# Patient Record
Sex: Female | Born: 1950 | Hispanic: Yes | Marital: Single | Smoking: Never smoker
Health system: Southern US, Community
[De-identification: ages and names within clinical notes are randomized; demographics above are authoritative.]

## PROBLEM LIST (undated history)

## (undated) DIAGNOSIS — J449 Chronic obstructive pulmonary disease, unspecified: Secondary | ICD-10-CM

---

## 2019-11-30 ENCOUNTER — Other Ambulatory Visit: Payer: Self-pay

## 2019-11-30 ENCOUNTER — Ambulatory Visit: Payer: Self-pay | Attending: Internal Medicine

## 2019-11-30 ENCOUNTER — Encounter (HOSPITAL_COMMUNITY): Payer: Self-pay

## 2019-11-30 ENCOUNTER — Emergency Department (HOSPITAL_COMMUNITY): Payer: Self-pay

## 2019-11-30 ENCOUNTER — Inpatient Hospital Stay (HOSPITAL_COMMUNITY)
Admission: EM | Admit: 2019-11-30 | Discharge: 2019-12-10 | DRG: 190 | Disposition: A | Discharge status: Home / Self Care | Payer: Self-pay | Attending: Internal Medicine | Admitting: Internal Medicine

## 2019-11-30 DIAGNOSIS — G9341 Metabolic encephalopathy: Secondary | ICD-10-CM | POA: Diagnosis present

## 2019-11-30 DIAGNOSIS — I358 Other nonrheumatic aortic valve disorders: Secondary | ICD-10-CM | POA: Diagnosis present

## 2019-11-30 DIAGNOSIS — E662 Morbid (severe) obesity with alveolar hypoventilation: Secondary | ICD-10-CM | POA: Diagnosis present

## 2019-11-30 DIAGNOSIS — T380X5A Adverse effect of glucocorticoids and synthetic analogues, initial encounter: Secondary | ICD-10-CM | POA: Diagnosis present

## 2019-11-30 DIAGNOSIS — Z9981 Dependence on supplemental oxygen: Secondary | ICD-10-CM

## 2019-11-30 DIAGNOSIS — R791 Abnormal coagulation profile: Secondary | ICD-10-CM | POA: Diagnosis present

## 2019-11-30 DIAGNOSIS — E875 Hyperkalemia: Secondary | ICD-10-CM | POA: Diagnosis present

## 2019-11-30 DIAGNOSIS — I2781 Cor pulmonale (chronic): Secondary | ICD-10-CM | POA: Diagnosis present

## 2019-11-30 DIAGNOSIS — J9622 Acute and chronic respiratory failure with hypercapnia: Secondary | ICD-10-CM | POA: Diagnosis present

## 2019-11-30 DIAGNOSIS — J441 Chronic obstructive pulmonary disease with (acute) exacerbation: Principal | ICD-10-CM | POA: Diagnosis present

## 2019-11-30 DIAGNOSIS — E872 Acidosis: Secondary | ICD-10-CM | POA: Diagnosis present

## 2019-11-30 DIAGNOSIS — Z86718 Personal history of other venous thrombosis and embolism: Secondary | ICD-10-CM

## 2019-11-30 DIAGNOSIS — J9621 Acute and chronic respiratory failure with hypoxia: Secondary | ICD-10-CM | POA: Diagnosis present

## 2019-11-30 DIAGNOSIS — Z7901 Long term (current) use of anticoagulants: Secondary | ICD-10-CM

## 2019-11-30 DIAGNOSIS — D751 Secondary polycythemia: Secondary | ICD-10-CM | POA: Diagnosis present

## 2019-11-30 DIAGNOSIS — J9811 Atelectasis: Secondary | ICD-10-CM | POA: Diagnosis not present

## 2019-11-30 DIAGNOSIS — E1165 Type 2 diabetes mellitus with hyperglycemia: Secondary | ICD-10-CM | POA: Diagnosis present

## 2019-11-30 DIAGNOSIS — Z6835 Body mass index (BMI) 35.0-35.9, adult: Secondary | ICD-10-CM

## 2019-11-30 DIAGNOSIS — I5032 Chronic diastolic (congestive) heart failure: Secondary | ICD-10-CM | POA: Diagnosis present

## 2019-11-30 DIAGNOSIS — Z23 Encounter for immunization: Secondary | ICD-10-CM

## 2019-11-30 DIAGNOSIS — I110 Hypertensive heart disease with heart failure: Secondary | ICD-10-CM | POA: Diagnosis present

## 2019-11-30 DIAGNOSIS — R0602 Shortness of breath: Secondary | ICD-10-CM

## 2019-11-30 DIAGNOSIS — Z79899 Other long term (current) drug therapy: Secondary | ICD-10-CM

## 2019-11-30 DIAGNOSIS — D1779 Benign lipomatous neoplasm of other sites: Secondary | ICD-10-CM | POA: Diagnosis present

## 2019-11-30 DIAGNOSIS — X58XXXA Exposure to other specified factors, initial encounter: Secondary | ICD-10-CM | POA: Diagnosis present

## 2019-11-30 DIAGNOSIS — R0989 Other specified symptoms and signs involving the circulatory and respiratory systems: Secondary | ICD-10-CM

## 2019-11-30 DIAGNOSIS — Z20822 Contact with and (suspected) exposure to covid-19: Secondary | ICD-10-CM | POA: Diagnosis present

## 2019-11-30 DIAGNOSIS — K5900 Constipation, unspecified: Secondary | ICD-10-CM | POA: Diagnosis present

## 2019-11-30 HISTORY — DX: Chronic obstructive pulmonary disease, unspecified: J44.9

## 2019-11-30 LAB — BASIC METABOLIC PANEL WITH GFR
Anion gap: 9 (ref 5–15)
BUN: 20 mg/dL (ref 8–23)
CO2: 32 mmol/L (ref 22–32)
Calcium: 8.6 mg/dL — ABNORMAL LOW (ref 8.9–10.3)
Chloride: 101 mmol/L (ref 98–111)
Creatinine, Ser: 1.03 mg/dL — ABNORMAL HIGH (ref 0.44–1.00)
GFR calc Af Amer: >60 mL/min
GFR calc non Af Amer: 56 mL/min — ABNORMAL LOW
Glucose, Bld: 116 mg/dL — ABNORMAL HIGH (ref 70–99)
Potassium: 5.1 mmol/L (ref 3.5–5.1)
Sodium: 142 mmol/L (ref 135–145)

## 2019-11-30 LAB — CBC WITH DIFFERENTIAL/PLATELET
Abs Immature Granulocytes: 0.08 10*3/uL — ABNORMAL HIGH (ref 0.00–0.07)
Basophils Absolute: 0 10*3/uL (ref 0.0–0.1)
Basophils Relative: 0 %
Eosinophils Absolute: 0.1 10*3/uL (ref 0.0–0.5)
Eosinophils Relative: 1 %
HCT: 52.3 % — ABNORMAL HIGH (ref 36.0–46.0)
Hemoglobin: 15.3 g/dL — ABNORMAL HIGH (ref 12.0–15.0)
Immature Granulocytes: 1 %
Lymphocytes Relative: 28 %
Lymphs Abs: 2.2 10*3/uL (ref 0.7–4.0)
MCH: 30.4 pg (ref 26.0–34.0)
MCHC: 29.3 g/dL — ABNORMAL LOW (ref 30.0–36.0)
MCV: 103.8 fL — ABNORMAL HIGH (ref 80.0–100.0)
Monocytes Absolute: 0.7 10*3/uL (ref 0.1–1.0)
Monocytes Relative: 9 %
Neutro Abs: 4.6 10*3/uL (ref 1.7–7.7)
Neutrophils Relative %: 61 %
Platelets: 200 10*3/uL (ref 150–400)
RBC: 5.04 MIL/uL (ref 3.87–5.11)
RDW: 18 % — ABNORMAL HIGH (ref 11.5–15.5)
WBC: 7.6 10*3/uL (ref 4.0–10.5)
nRBC: 0.4 % — ABNORMAL HIGH (ref 0.0–0.2)

## 2019-11-30 LAB — I-STAT BETA HCG BLOOD, ED (MC, WL, AP ONLY): I-stat hCG, quantitative: <5 m[IU]/mL

## 2019-11-30 LAB — BRAIN NATRIURETIC PEPTIDE: B Natriuretic Peptide: 69.2 pg/mL (ref 0.0–100.0)

## 2019-11-30 MED ORDER — PREDNISONE 20 MG PO TABS: 40.0000 mg | ORAL_TABLET | Freq: Every day | ORAL | Status: DC

## 2019-11-30 MED ORDER — AZITHROMYCIN 500 MG PO TABS
250.0000 mg | ORAL_TABLET | Freq: Every day | ORAL | Status: AC
  Administered 2019-12-01 – 2019-12-04 (×4): 250 mg via ORAL
  Filled 2019-11-30 (×4): qty 1

## 2019-11-30 MED ORDER — PANTOPRAZOLE SODIUM 20 MG PO TBEC
20.0000 mg | DELAYED_RELEASE_TABLET | Freq: Every day | ORAL | Status: DC
  Filled 2019-11-30 (×2): qty 1

## 2019-11-30 MED ORDER — SODIUM CHLORIDE 0.9% FLUSH
3.0000 mL | Freq: Two times a day (BID) | INTRAVENOUS | Status: DC
  Administered 2019-12-01 – 2019-12-10 (×19): 3 mL via INTRAVENOUS

## 2019-11-30 MED ORDER — METHYLPREDNISOLONE SODIUM SUCC 125 MG IJ SOLR
125.0000 mg | Freq: Once | INTRAMUSCULAR | Status: AC
  Administered 2019-11-30: 125 mg via INTRAVENOUS
  Filled 2019-11-30: qty 2

## 2019-11-30 MED ORDER — FUROSEMIDE 10 MG/ML IJ SOLN
40.0000 mg | Freq: Once | INTRAMUSCULAR | Status: AC
  Administered 2019-11-30: 40 mg via INTRAVENOUS
  Filled 2019-11-30: qty 4

## 2019-11-30 MED ORDER — ALBUTEROL SULFATE HFA 108 (90 BASE) MCG/ACT IN AERS
2.0000 | INHALATION_SPRAY | Freq: Once | RESPIRATORY_TRACT | Status: AC
  Administered 2019-11-30: 2 via RESPIRATORY_TRACT
  Filled 2019-11-30: qty 6.7

## 2019-11-30 MED ORDER — ALBUTEROL SULFATE HFA 108 (90 BASE) MCG/ACT IN AERS
4.0000 | INHALATION_SPRAY | Freq: Once | RESPIRATORY_TRACT | Status: AC
  Administered 2019-11-30: 4 via RESPIRATORY_TRACT
  Filled 2019-11-30: qty 6.7

## 2019-11-30 MED ORDER — AZITHROMYCIN 250 MG PO TABS
500.0000 mg | ORAL_TABLET | Freq: Every day | ORAL | Status: AC
  Administered 2019-12-01: 500 mg via ORAL
  Filled 2019-11-30: qty 2

## 2019-11-30 NOTE — ED Notes (Signed)
[name], son with # listed in contacts, requesting update. Provider aware

## 2019-11-30 NOTE — ED Triage Notes (Signed)
[place] and has had no oxygen x 4 days for her COPD. Oxygen sats 90, no complints

## 2019-11-30 NOTE — Progress Notes (Signed)
   Covid-19 Vaccination Clinic  Name:  Lucas Exline    MRN: [id number] DOB: 04/10/1951  11/30/2019  Ms. Henault was observed post Covid-19 immunization for 15 minutes without incident. She was provided with Vaccine Information Sheet and instruction to access the V-Safe system.   Ms. Meinders was instructed to call 911 with any severe reactions post vaccine: Marland Kitchen Difficulty breathing  . Swelling of face and throat  . A fast heartbeat  . A bad rash all over body  . Dizziness and weakness   Immunizations Administered    Name Date Dose VIS Date Route   JANSSEN COVID-19 VACCINE 11/30/2019  9:02 AM 0.5 mL 08/03/2019 Intramuscular   Manufacturer: [employer]   Lot: [id number]   [place]: [id number]

## 2019-11-30 NOTE — ED Notes (Signed)
Pt placed on cardiac moniter

## 2019-11-30 NOTE — ED Provider Notes (Signed)
Care assumed from [facility], please see her note for full details, but in brief Kelly Booker is a 69 y.o. female who traveled here 4 days ago from [place], has a history of COPD and is supposed to wear 2.5 L of oxygen at night.  She states that she started to have worsening shortness of breath today, checked her oxygen sats and they were in the 70s, and felt like she was having a COPD exacerbation.  Patient placed on 2-3 L here in the ED, and since then has been satting well, when taken off of oxygen patient drops into the 80s.  Social work and case management are not currently available to help get patient home oxygen, but patient does not want to be admitted to the hospital per conversations with previous provider.  Chest x-ray with signs of COPD as well as CHF with some pulmonary vascular congestion.  Labs are pending and then will discuss further with patient.  PA feels he spoke with house [facility] who recommended overnight observation in the ED so that case management and social work can facilitate oxygen for patient in the morning if she does not wish to be admitted.  Physical Exam  BP 139/64 (BP Location: Left Arm)   Pulse 61   Temp 98.2 F (36.8 C) (Oral)   Resp (!) 22   SpO2 94%   Physical Exam Constitutional:      Appearance: She is obese. She is not ill-appearing.  Pulmonary:     Breath sounds: Wheezing present.     Comments: On 3 L patient breathing comfortably, she still has wheezes noted bilaterally, with decreased air movement throughout Skin:    General: Skin is warm and dry.  Neurological:     Mental Status: She is alert and oriented to person, place, and time.  Psychiatric:        Mood and Affect: Mood normal.        Behavior: Behavior normal.     ED Course/Procedures   Labs Reviewed  BASIC METABOLIC PANEL - Abnormal; Notable for the following components:      Result Value   Glucose, Bld 116 (*)    Creatinine, Ser 1.03 (*)    Calcium 8.6 (*)    GFR calc non Af Amer  56 (*)    All other components within normal limits  CBC WITH DIFFERENTIAL/PLATELET - Abnormal; Notable for the following components:   Hemoglobin 15.3 (*)    HCT 52.3 (*)    MCV 103.8 (*)    MCHC 29.3 (*)    RDW 18.0 (*)    nRBC 0.4 (*)    Abs Immature Granulocytes 0.08 (*)    All other components within normal limits  BRAIN NATRIURETIC PEPTIDE   EKG Interpretation  Date/Time:  Saturday November 30 2019 19:40:06 EDT Ventricular Rate:  62 PR Interval:  182 QRS Duration: 102 QT Interval:  418 QTC Calculation: 424 R Axis:   81 Text Interpretation: Normal sinus rhythm Normal ECG No prior eCG for comparison. No STEMI Confirmed by Antony Blackbird [phone]) on 11/30/2019 9:00:35 PM  DG Chest 2 View  Result Date: 11/30/2019 CLINICAL DATA:  On oxygen did in at home in [place]. She was not allowed to bring her oxygen tank to the [place]. EXAM: CHEST - 2 VIEW COMPARISON:  None. FINDINGS: Enlarged cardiac silhouette. Mildly prominent pulmonary vasculature and interstitial markings without Kerley lines. Small amount of linear atelectasis or scarring in the left lower lung zone and right lung  base. The lungs are mildly hyperexpanded. No pleural fluid. Diffuse osteopenia. IMPRESSION: 1. Cardiomegaly and mild pulmonary vascular congestion. 2. Mild changes of COPD. Electronically Signed   By: Claudie Revering M.D.   On: 11/30/2019 19:14   Procedures  MDM   Using Spanish interpreter results of patient's work-up discussed with her, at baseline she usually only requires oxygen at night, but now is requiring oxygen consistently where she desats into the 80s.  Her chest x-ray also shows possible signs of CHF.  Discussed this with the patient and that we could not get her oxygen to go home with until tomorrow morning, she was initially hesitant to be admitted, but I called and spoke with her son Kelly Booker who is also very concerned about her and would like her to be admitted to the hospital and she is now  agreeable.  Will consult hospitalist for admission.  Case discussed with Dr. Marcello Moores with [facility] who will see admit the patient, she would like to add on an ABG, troponin and D-dimer, given limited history from patient and unclear medical history.      Jacqlyn Larsen, PA-C 12/01/19 [id number]    Virgel Manifold, MD 12/01/19 1525

## 2019-11-30 NOTE — ED Notes (Signed)
Patient transported to X-ray 

## 2019-11-30 NOTE — H&P (Signed)
History and Physical    Kimbree Casanas MOQ:[id number] DOB: 1950/11/26 DOA: 11/30/2019  PCP: Patient, No Pcp Per  Patient coming from: son's home  I have personally briefly reviewed patient's old medical records in [place]  Chief Complaint: sob , requesting oxygen  HPI: Mava Suares is a 69 y.o. female with medical history significant of  COPD on nocturnal O2 2L, use of anticoagulation for currently unknown diagnosis. Patient is spanish speaking and difficult historian per chart patient arrived from [place] 4 days ago without her nocturnal O2 due not being allowed to transport O2 on her flight. Patient states she woke up this am with increase sob and checked her O2 saturation at home and noted it was in the 70's.  She notes she does not require O2 other than at night time at her baseline. She denies any increase cough or sputum production, any fever, chills, chest pain , palpitations, presyncope, leg swelling, leg pain or orthopnea. Patient also denies any cardiac history.  When asked why she is on anticoagulation with xarleto she states she was told her blood was too thick.  In any event patient presents to ed in house of obtaining O2 tank. Patient also states that this am she has a syncopal episode which she states was due to her lack of oxygen.  Patient was not able to elaborate further.  ED Course: Afeb, bp 113/67, hr 78, rr18,  Sat 92% o 2L  ekg nsr , no st -twave changes   Cxr: IMPRESSION: 1. Cardiomegaly and mild pulmonary vascular congestion. 2. Mild changes of COPD.  Labs: wbc 7.6, hbg 15.3/HCT, MCV 103.8 K:5.1, glu 116, cr 1.03 BNP 69.2 Review of Systems: As per HPI otherwise 10 point review of systems negative.   [id number]  Past Medical History:  Diagnosis Date  . COPD (chronic obstructive pulmonary disease) ([place])     History reviewed. No pertinent surgical history.   has no history on file for tobacco use, alcohol use, and drug use.  No Known  Allergies  No family history on file.  Prior to Admission medications   Medication Sig Start Date End Date Taking? Authorizing Provider  OXYGEN Inhale 2.5-3 L into the lungs continuous.    Yes [provider]  PRESCRIPTION MEDICATION Take 1,000 mg by mouth daily at 2 PM. "[place]on" from [facility]   Yes [provider]  PRESCRIPTION MEDICATION Take 25 mg by mouth daily. "Spirotard" from [place]   Yes [provider]  PRESCRIPTION MEDICATION Take 12 mcg by mouth daily. "Aerofor" from [place] -  inhale the contents of one capsule (12 mcg) into the lungs once daily   Yes [provider]  rivaroxaban (XARELTO) 20 MG TABS tablet Take 20 mg by mouth every morning.   Yes [provider]  tiotropium (SPIRIVA) 18 MCG inhalation capsule Place 18 mcg into inhaler and inhale daily.   Yes [provider]    Physical Exam: Vitals:   11/30/19 1405 11/30/19 1953  BP: 113/67 139/64  Pulse: 78 61  Resp: 18 (!) 22  Temp: 98.2 F (36.8 C)   TempSrc: Oral   SpO2: 92% 94%    Constitutional: NAD, calm, comfortable Vitals:   11/30/19 1405 11/30/19 1953  BP: 113/67 139/64  Pulse: 78 61  Resp: 18 (!) 22  Temp: 98.2 F (36.8 C)   TempSrc: Oral   SpO2: 92% 94%   Eyes: PERRL, lids and conjunctivae normal ENMT: Mucous membranes are moist. Posterior pharynx clear of any  exudate or lesions.Normal dentition.  Neck: normal, supple, no masses, no thyromegaly Respiratory: clear to auscultation bilaterally, no wheezing, no crackles. Normal respiratory effort. No accessory muscle use.  Cardiovascular: Regular rate and rhythm, no murmurs / rubs / gallops. No extremity edema. 2+ pedal pulses. No carotid bruits.  Abdomen: no tenderness, no masses palpated. No hepatosplenomegaly. Bowel sounds positive.  Musculoskeletal: no clubbing / cyanosis. No joint deformity upper and lower extremities. Good ROM, no contractures. Normal muscle tone.  Skin: no rashes,  lesions, ulcers. No induration Neurologic: CN 2-12 grossly intact. Sensation intact, DTR normal. Strength 5/5 in all 4.  Psychiatric: Normal judgment and insight. Alert and oriented x 3. Normal mood.    Labs on Admission: I have personally reviewed following labs and imaging studies  CBC: Recent Labs  Lab 11/30/19 2040  WBC 7.6  NEUTROABS 4.6  HGB 15.3*  HCT 52.3*  MCV 103.8*  PLT 419   Basic Metabolic Panel: Recent Labs  Lab 11/30/19 2040  NA 142  K 5.1  CL 101  CO2 32  GLUCOSE 116*  BUN 20  CREATININE 1.03*  CALCIUM 8.6*   GFR: CrCl cannot be calculated (Unknown ideal weight.). Liver Function Tests: No results for input(s): AST, ALT, ALKPHOS, BILITOT, PROT, ALBUMIN in the last 168 hours. No results for input(s): LIPASE, AMYLASE in the last 168 hours. No results for input(s): AMMONIA in the last 168 hours. Coagulation Profile: No results for input(s): INR, PROTIME in the last 168 hours. Cardiac Enzymes: No results for input(s): CKTOTAL, CKMB, CKMBINDEX, TROPONINI in the last 168 hours. BNP (last 3 results) No results for input(s): PROBNP in the last 8760 hours. HbA1C: No results for input(s): HGBA1C in the last 72 hours. CBG: No results for input(s): GLUCAP in the last 168 hours. Lipid Profile: No results for input(s): CHOL, HDL, LDLCALC, TRIG, CHOLHDL, LDLDIRECT in the last 72 hours. Thyroid Function Tests: No results for input(s): TSH, T4TOTAL, FREET4, T3FREE, THYROIDAB in the last 72 hours. Anemia Panel: No results for input(s): VITAMINB12, FOLATE, FERRITIN, TIBC, IRON, RETICCTPCT in the last 72 hours. Urine analysis: No results found for: COLORURINE, APPEARANCEUR, [place], [place], GLUCOSEU, [place], [place], [place], PROTEINUR, [place], NITRITE, LEUKOCYTESUR  Radiological Exams on Admission: DG Chest 2 View  Result Date: 11/30/2019 CLINICAL DATA:  On oxygen did in at home in [place]. She was not allowed to bring her oxygen tank to the [place]. EXAM: CHEST - 2 VIEW COMPARISON:  None. FINDINGS: Enlarged cardiac silhouette. Mildly prominent pulmonary vasculature and interstitial markings without Kerley lines. Small amount of linear atelectasis or scarring in the left lower lung zone and right lung base. The lungs are mildly hyperexpanded. No pleural fluid. Diffuse osteopenia. IMPRESSION: 1. Cardiomegaly and mild pulmonary vascular congestion. 2. Mild changes of COPD. Electronically Signed   By: Claudie Revering M.D.   On: 11/30/2019 19:14    EKG: Independently reviewed.see above  Assessment/Plan Acute COPD exacerbation with acute hypoxic and hypercarbic respiratory failure  -prednisone/azithromycin  - abg with increase  Ph 7.38 of c02 to 83   -place on bipap  -neb standing and prn   New DX of Congestive heart failure /element of Corpulmonale -ef unknown  -lasix bid iv  -echo in am  -cycle ce  -consider cardiology consult   ? PE/DVT hx  -patient unable to give diagnosis for which she uses xarelto  -will continue   FEN Replete electrolytes prn  DVT prophylaxis: xarelto  Code Status: Full Family Communication: discussed with son  Disposition  Plan:3-5 days  Consults called: consider cardiology consult in am  Admission status: SDU :Clance Boll MD [facility]  If 7PM-7AM, please contact night-coverage [email or web address] Password [facility]  11/30/2019, 11:03 PM

## 2019-11-30 NOTE — ED Provider Notes (Signed)
[place] EMERGENCY DEPARTMENT Provider Note   CSN: [id number] Arrival date & time: 11/30/19  1400     History Chief Complaint  Patient presents with  . no oxygen tank    Kelly Booker is a 69 y.o. female with history of COPD presents for evaluation of acute onset, persistent shortness of breath since this morning.  She reports that she traveled from [place] on 11/28/2019 2 days ago.  She reports that she had a 2-hour flight from [place] to [place] followed by a 3-hour flight from [place] to [place].  She is currently staying with her son who lives here.  She awoke this morning feeling short of breath and checked her oxygen saturations and noted them to be in the 70s.  She states that she typically wears 2.5LPM oxygen at night at home in [place] but states that they did not allow her to bring her oxygen tank on the plane.  She is a never smoker.  She denies chest pain, fevers, cough, abdominal pain, nausea, vomiting, leg pain or leg swelling.  She states she has been taking all of her home medications.  She has her medication at the bedside.  Some of the medications are in Spanish so I am not sure what they are but it appears that she is on Spiriva as well as Xarelto.  When I asked her why she is on Xarelto she states "they told me that my blood was too thick".  When asked specifically about DVT and PE she denies this but is unsure.  Patient is Spanish-speaking and a Optometrist was used throughout the encounter.  The history is provided by the patient. The history is limited by a language barrier. A language interpreter was used.       Past Medical History:  Diagnosis Date  . COPD (chronic obstructive pulmonary disease) ([facility])     There are no problems to display for this patient.   History reviewed. No pertinent surgical history.   OB History   No obstetric history on file.     No family history on file.  Social History   Tobacco Use  . Smoking  status: Not on file  Substance Use Topics  . Alcohol use: Not on file  . Drug use: Not on file    Home Medications Prior to Admission medications   Not on File    Allergies    Patient has no known allergies.  Review of Systems   Review of Systems  Constitutional: Negative for chills and fever.  Respiratory: Positive for shortness of breath. Negative for cough.   Cardiovascular: Negative for chest pain and leg swelling.  Gastrointestinal: Negative for abdominal pain, nausea and vomiting.  All other systems reviewed and are negative.   Physical Exam Updated Vital Signs BP 139/64 (BP Location: Left Arm)   Pulse 61   Temp 98.2 F (36.8 C) (Oral)   Resp (!) 22   SpO2 94%   Physical Exam Vitals and nursing note reviewed.  Constitutional:      General: She is not in acute distress.    Appearance: She is well-developed.  HENT:     Head: Normocephalic and atraumatic.  Eyes:     General:        Right eye: No discharge.        Left eye: No discharge.     Conjunctiva/sclera: Conjunctivae normal.  Neck:     Vascular: No JVD.     Trachea: No tracheal deviation.  Cardiovascular:     Rate and Rhythm: Normal rate and regular rhythm.     Pulses: Normal pulses.     Comments: 2+ radial and DP/PT pulses bilaterally, Homans sign absent bilaterally, no lower extremity edema, no palpable cords, compartments are soft   Pulmonary:     Effort: Pulmonary effort is normal.     Comments: Globally diminished breath sounds.  SPO2 saturations dropped to 88% on room air.  Improved on 2 to 3 L via nasal cannula. Abdominal:     General: Bowel sounds are normal. There is no distension.     Tenderness: There is no abdominal tenderness. There is no guarding or rebound.  Musculoskeletal:     Cervical back: Neck supple.  Skin:    General: Skin is warm and dry.     Findings: No erythema.  Neurological:     Mental Status: She is alert.  Psychiatric:        Behavior: Behavior normal.      ED Results / Procedures / Treatments   Labs (all labs ordered are listed, but only abnormal results are displayed) Labs Reviewed  BASIC METABOLIC PANEL  CBC WITH DIFFERENTIAL/PLATELET  BRAIN NATRIURETIC PEPTIDE    EKG None  Radiology DG Chest 2 View  Result Date: 11/30/2019 CLINICAL DATA:  On oxygen did in at home in [place]. She was not allowed to bring her oxygen tank to the [place]. EXAM: CHEST - 2 VIEW COMPARISON:  None. FINDINGS: Enlarged cardiac silhouette. Mildly prominent pulmonary vasculature and interstitial markings without Kerley lines. Small amount of linear atelectasis or scarring in the left lower lung zone and right lung base. The lungs are mildly hyperexpanded. No pleural fluid. Diffuse osteopenia. IMPRESSION: 1. Cardiomegaly and mild pulmonary vascular congestion. 2. Mild changes of COPD. Electronically Signed   By: Claudie Revering M.D.   On: 11/30/2019 19:14    Procedures Procedures (including critical care time)  Medications Ordered in ED Medications  methylPREDNISolone sodium succinate (SOLU-MEDROL) 125 mg/2 mL injection 125 mg (has no administration in time range)  albuterol (VENTOLIN HFA) 108 (90 Base) MCG/ACT inhaler 2 puff (has no administration in time range)    ED Course  I have reviewed the triage vital signs and the nursing notes.  Pertinent labs & imaging results that were available during my care of the patient were reviewed by me and considered in my medical decision making (see chart for details).    MDM Rules/Calculators/A&P                          Patient presents requesting an oxygen tank.  She is afebrile, SPO2 saturations down to 88% on room air.  She reports she typically only uses oxygen at night.  States that her O2 saturations today upon awakening were in the 70s and she felt short of breath.  She states she was unable to take her oxygen on the airplane to get to the [place].  She is mildly tachypneic but does not exhibit markedly  increased work of breathing.  She denies leg pain, leg swelling, or chest pain.  PE is certainly on the differential however she feels strongly that her symptoms are consistent with her usual COPD exacerbations.  She is on Xarelto based on the medications she has at the bedside but she cannot tell me why she is on Xarelto.  She denies definite history of DVT or PE.  We will plan to obtain lab work, chest x-ray,  EKG for further evaluation.  I explained to the patient that we may not be able to get her supplemental oxygen to go home with and that ultimately with her hypoxia she would likely benefit from admission for management of COPD exacerbation.  She verbalizes that she would like to go home if at all possible and all she really wants is an oxygen tank to go home with.  In the meantime we will give IV Solu-Medrol and albuterol.  EKG shows no acute ischemic abnormalities. Chest x-ray shows changes consistent with COPD as well as cardiomegaly and mild pulmonary vascular congestion which could be suggestive of CHF.  Patient does not appear markedly volume overloaded peripherally but could have a history of CHF though she denies this to me.  8:52 PM Spoke with Dannielle Karvonen.  She has contacted one of the social workers in the hospital (unfortunately our social workers in the ED are gone for the day).  She recommends keeping the patient overnight and consulting case management/social work in the morning to arrange for delivery of a tank.  She reports that historically some of the tanks that we have available have been unreliable and that we need to arrange for an oxygen tank from a formal vendor.  9:00PM Signed out care to oncoming provider PA Ford.  Pending lab work and reassessment.  She would most likely benefit from admission for management of COPD exacerbation. If not agreeable to admission, she may be amenable to staying in the department overnight awaiting case management/social work involvement to arrange  for outpatient supplemental oxygen.    Final Clinical Impression(s) / ED Diagnoses Final diagnoses:  COPD exacerbation [facility])    Rx / [place] Orders ED Discharge Orders    None       Debroah Baller 11/30/19 2101    Virgel Manifold, MD 12/01/19 1525

## 2019-12-01 ENCOUNTER — Encounter (HOSPITAL_COMMUNITY): Payer: Self-pay | Admitting: Internal Medicine

## 2019-12-01 ENCOUNTER — Inpatient Hospital Stay (HOSPITAL_COMMUNITY): Payer: Self-pay

## 2019-12-01 DIAGNOSIS — E875 Hyperkalemia: Secondary | ICD-10-CM

## 2019-12-01 DIAGNOSIS — Z7901 Long term (current) use of anticoagulants: Secondary | ICD-10-CM

## 2019-12-01 LAB — BASIC METABOLIC PANEL
Anion gap: 11 (ref 5–15)
Anion gap: 10 (ref 5–15)
BUN: 20 mg/dL (ref 8–23)
BUN: 22 mg/dL (ref 8–23)
CO2: 35 mmol/L — ABNORMAL HIGH (ref 22–32)
CO2: 36 mmol/L — ABNORMAL HIGH (ref 22–32)
Calcium: 9.2 mg/dL (ref 8.9–10.3)
Calcium: 9.1 mg/dL (ref 8.9–10.3)
Chloride: 94 mmol/L — ABNORMAL LOW (ref 98–111)
Chloride: 95 mmol/L — ABNORMAL LOW (ref 98–111)
Creatinine, Ser: 1.02 mg/dL — ABNORMAL HIGH (ref 0.44–1.00)
Creatinine, Ser: 1.18 mg/dL — ABNORMAL HIGH (ref 0.44–1.00)
GFR calc Af Amer: >60 mL/min (ref >60)
GFR calc Af Amer: 55 mL/min — ABNORMAL LOW (ref >60)
GFR calc non Af Amer: 56 mL/min — ABNORMAL LOW (ref >60)
GFR calc non Af Amer: 47 mL/min — ABNORMAL LOW (ref >60)
Glucose, Bld: 170 mg/dL — ABNORMAL HIGH (ref 70–99)
Glucose, Bld: 172 mg/dL — ABNORMAL HIGH (ref 70–99)
Potassium: 5.2 mmol/L — ABNORMAL HIGH (ref 3.5–5.1)
Potassium: 5.7 mmol/L — ABNORMAL HIGH (ref 3.5–5.1)
Sodium: 140 mmol/L (ref 135–145)
Sodium: 141 mmol/L (ref 135–145)

## 2019-12-01 LAB — CBC
HCT: 55.2 % — ABNORMAL HIGH (ref 36.0–46.0)
Hemoglobin: 16.3 g/dL — ABNORMAL HIGH (ref 12.0–15.0)
MCH: 30.3 pg (ref 26.0–34.0)
MCHC: 29.5 g/dL — ABNORMAL LOW (ref 30.0–36.0)
MCV: 102.6 fL — ABNORMAL HIGH (ref 80.0–100.0)
Platelets: 216 10*3/uL (ref 150–400)
RBC: 5.38 MIL/uL — ABNORMAL HIGH (ref 3.87–5.11)
RDW: 17.9 % — ABNORMAL HIGH (ref 11.5–15.5)
WBC: 9.6 10*3/uL (ref 4.0–10.5)
nRBC: 0.3 % — ABNORMAL HIGH (ref 0.0–0.2)

## 2019-12-01 LAB — TROPONIN I (HIGH SENSITIVITY): Troponin I (High Sensitivity): 19 ng/L — ABNORMAL HIGH (ref <18)

## 2019-12-01 LAB — POCT I-STAT 7, (LYTES, BLD GAS, ICA,H+H)
Acid-Base Excess: 10 mmol/L — ABNORMAL HIGH (ref 0.0–2.0)
Bicarbonate: 41.4 mmol/L — ABNORMAL HIGH (ref 20.0–28.0)
Calcium, Ion: 1.23 mmol/L (ref 1.15–1.40)
HCT: 55 % — ABNORMAL HIGH (ref 36.0–46.0)
Hemoglobin: 18.7 g/dL — ABNORMAL HIGH (ref 12.0–15.0)
O2 Saturation: 90 %
Potassium: 5.1 mmol/L (ref 3.5–5.1)
Sodium: 143 mmol/L (ref 135–145)
TCO2: 44 mmol/L — ABNORMAL HIGH (ref 22–32)
pCO2 arterial: 80.1 mmHg (ref 32.0–48.0)
pH, Arterial: 7.322 — ABNORMAL LOW (ref 7.350–7.450)
pO2, Arterial: 68 mmHg — ABNORMAL LOW (ref 83.0–108.0)

## 2019-12-01 LAB — LACTIC ACID, PLASMA: Lactic Acid, Venous: 0.9 mmol/L (ref 0.5–1.9)

## 2019-12-01 LAB — GLUCOSE, CAPILLARY
Glucose-Capillary: 119 mg/dL — ABNORMAL HIGH (ref 70–99)
Glucose-Capillary: 131 mg/dL — ABNORMAL HIGH (ref 70–99)
Glucose-Capillary: 131 mg/dL — ABNORMAL HIGH (ref 70–99)
Glucose-Capillary: 148 mg/dL — ABNORMAL HIGH (ref 70–99)
Glucose-Capillary: 163 mg/dL — ABNORMAL HIGH (ref 70–99)
Glucose-Capillary: 170 mg/dL — ABNORMAL HIGH (ref 70–99)

## 2019-12-01 LAB — RESPIRATORY PANEL BY PCR

## 2019-12-01 LAB — URINALYSIS, COMPLETE (UACMP) WITH MICROSCOPIC
Bacteria, UA: NONE SEEN
Bilirubin Urine: NEGATIVE
Glucose, UA: NEGATIVE mg/dL
Ketones, ur: NEGATIVE mg/dL
Leukocytes,Ua: NEGATIVE
Nitrite: NEGATIVE
Protein, ur: NEGATIVE mg/dL
Specific Gravity, Urine: 1.008 (ref 1.005–1.030)
pH: 5 (ref 5.0–8.0)

## 2019-12-01 LAB — PROCALCITONIN: Procalcitonin: <0.1 ng/mL

## 2019-12-01 LAB — HEMOGLOBIN A1C
Hgb A1c MFr Bld: 7 % — ABNORMAL HIGH (ref 4.8–5.6)
Mean Plasma Glucose: 154.2 mg/dL

## 2019-12-01 LAB — D-DIMER, QUANTITATIVE: D-Dimer, Quant: 0.42 ug{FEU}/mL (ref 0.00–0.50)

## 2019-12-01 LAB — HIV ANTIBODY (ROUTINE TESTING W REFLEX): HIV Screen 4th Generation wRfx: NONREACTIVE

## 2019-12-01 LAB — I-STAT ARTERIAL BLOOD GAS, ED
Acid-Base Excess: 11 mmol/L — ABNORMAL HIGH (ref 0.0–2.0)
Bicarbonate: 42.3 mmol/L — ABNORMAL HIGH (ref 20.0–28.0)
Calcium, Ion: 1.21 mmol/L (ref 1.15–1.40)
HCT: 49 % — ABNORMAL HIGH (ref 36.0–46.0)
Hemoglobin: 16.7 g/dL — ABNORMAL HIGH (ref 12.0–15.0)
O2 Saturation: 92 %
Patient temperature: 98.2
Potassium: 4.1 mmol/L (ref 3.5–5.1)
Sodium: 131 mmol/L — ABNORMAL LOW (ref 135–145)
TCO2: 45 mmol/L — ABNORMAL HIGH (ref 22–32)
pCO2 arterial: 84.3 mmHg (ref 32.0–48.0)
pH, Arterial: 7.308 — ABNORMAL LOW (ref 7.350–7.450)
pO2, Arterial: 72 mmHg — ABNORMAL LOW (ref 83.0–108.0)

## 2019-12-01 LAB — BLOOD GAS, ARTERIAL
Acid-Base Excess: 11.6 mmol/L — ABNORMAL HIGH (ref 0.0–2.0)
Bicarbonate: 39.7 mmol/L — ABNORMAL HIGH (ref 20.0–28.0)
Drawn by: 418751
FIO2: 40
O2 Saturation: 95.2 %
Patient temperature: 36.9
pCO2 arterial: 105 mmHg (ref 32.0–48.0)
pH, Arterial: 7.201 — ABNORMAL LOW (ref 7.350–7.450)
pO2, Arterial: 89.3 mmHg (ref 83.0–108.0)

## 2019-12-01 LAB — SARS CORONAVIRUS 2 BY RT PCR (HOSPITAL ORDER, PERFORMED IN ~~LOC~~ HOSPITAL LAB): SARS Coronavirus 2: NEGATIVE

## 2019-12-01 LAB — MRSA PCR SCREENING: MRSA by PCR: NEGATIVE

## 2019-12-01 LAB — PROTIME-INR
INR: 1.6 — ABNORMAL HIGH (ref 0.8–1.2)
Prothrombin Time: 18.2 seconds — ABNORMAL HIGH (ref 11.4–15.2)

## 2019-12-01 LAB — TSH: TSH: 2.401 u[IU]/mL (ref 0.350–4.500)

## 2019-12-01 LAB — BRAIN NATRIURETIC PEPTIDE: B Natriuretic Peptide: 56.7 pg/mL (ref 0.0–100.0)

## 2019-12-01 MED ORDER — INSULIN ASPART 100 UNIT/ML ~~LOC~~ SOLN
2.0000 [IU] | SUBCUTANEOUS | Status: DC
  Administered 2019-12-01: 2 [IU] via SUBCUTANEOUS
  Administered 2019-12-01: 4 [IU] via SUBCUTANEOUS

## 2019-12-01 MED ORDER — SODIUM ZIRCONIUM CYCLOSILICATE 10 G PO PACK
10.0000 g | PACK | Freq: Once | ORAL | Status: AC
  Administered 2019-12-01: 10 g via ORAL
  Filled 2019-12-01: qty 1

## 2019-12-01 MED ORDER — CHLORHEXIDINE GLUCONATE CLOTH 2 % EX PADS
6.0000 | MEDICATED_PAD | Freq: Every day | CUTANEOUS | Status: DC
  Administered 2019-12-01 – 2019-12-06 (×6): 6 via TOPICAL

## 2019-12-01 MED ORDER — CHLORHEXIDINE GLUCONATE 0.12 % MT SOLN
15.0000 mL | Freq: Two times a day (BID) | OROMUCOSAL | Status: DC
  Administered 2019-12-01 – 2019-12-09 (×15): 15 mL via OROMUCOSAL
  Filled 2019-12-01 (×12): qty 15

## 2019-12-01 MED ORDER — ORAL CARE MOUTH RINSE
15.0000 mL | Freq: Two times a day (BID) | OROMUCOSAL | Status: DC
  Administered 2019-12-01 – 2019-12-09 (×11): 15 mL via OROMUCOSAL

## 2019-12-01 MED ORDER — ENOXAPARIN SODIUM 80 MG/0.8ML ~~LOC~~ SOLN
1.0000 mg/kg | Freq: Two times a day (BID) | SUBCUTANEOUS | Status: DC
  Administered 2019-12-01 – 2019-12-02 (×3): 80 mg via SUBCUTANEOUS
  Filled 2019-12-01 (×3): qty 0.8

## 2019-12-01 MED ORDER — SODIUM CHLORIDE 0.9 % IV SOLN
2.0000 g | INTRAVENOUS | Status: DC
  Administered 2019-12-01 – 2019-12-02 (×2): 2 g via INTRAVENOUS
  Filled 2019-12-01 (×2): qty 20

## 2019-12-01 MED ORDER — METHYLPREDNISOLONE SODIUM SUCC 125 MG IJ SOLR
60.0000 mg | Freq: Every day | INTRAMUSCULAR | Status: DC
  Administered 2019-12-01: 60 mg via INTRAVENOUS
  Filled 2019-12-01: qty 2

## 2019-12-01 MED ORDER — IPRATROPIUM-ALBUTEROL 0.5-2.5 (3) MG/3ML IN SOLN
3.0000 mL | Freq: Four times a day (QID) | RESPIRATORY_TRACT | Status: DC
  Administered 2019-12-02 – 2019-12-03 (×5): 3 mL via RESPIRATORY_TRACT
  Filled 2019-12-01 (×5): qty 3

## 2019-12-01 MED ORDER — ALBUTEROL SULFATE (2.5 MG/3ML) 0.083% IN NEBU: 2.5000 mg | INHALATION_SOLUTION | RESPIRATORY_TRACT | Status: DC | PRN

## 2019-12-01 MED ORDER — FUROSEMIDE 10 MG/ML IJ SOLN
20.0000 mg | Freq: Two times a day (BID) | INTRAMUSCULAR | Status: DC
  Administered 2019-12-01 – 2019-12-02 (×3): 20 mg via INTRAVENOUS
  Filled 2019-12-01 (×3): qty 2

## 2019-12-01 MED ORDER — IPRATROPIUM-ALBUTEROL 0.5-2.5 (3) MG/3ML IN SOLN
3.0000 mL | RESPIRATORY_TRACT | Status: DC
  Administered 2019-12-01 (×3): 3 mL via RESPIRATORY_TRACT
  Filled 2019-12-01 (×4): qty 3

## 2019-12-01 NOTE — Significant Event (Addendum)
Rapid Response Event Note  Overview: Called by RT because pt was minimally responsive when they saw her to obtain an ABG d/t decreased LOC.  ABG at 0014-7.30/84.3/72/42.3. Pt was placed on bipap at 0245. Since then, per RN, pt's LOC has decreased. Repeat ABG at 0625-7.20/105/89.3/39.7.  Initial Focused Assessment: Pt laying in bed with eyes closed. Pt will open eyes when asked to in [place], however, goes back to sleep very easily. Lungs diminished t/o. Skin warm and dry. T-97.8, HR-63, BP-119/67, RR-17, SpO2-88% on bipap .30.   Interventions: FiO2 increased to 100%-SpO2 increased to 99%. PCCM consulted Pt transferred to [place] (if not transferred): Tx to ICU Event Summary:  Dr. Marcello Moores notified at [place] by Respiratory at [place] Arrived: 0650 Ended: 0730  Dillard [place]

## 2019-12-01 NOTE — H&P (Signed)
NAME:  Kelly Booker, MRN:  [id number], DOB:  1950-06-20, LOS: 1 ADMISSION DATE:  11/30/2019, CONSULTATION DATE:  12/01/19 REFERRING MD:  Marcello Moores, CHIEF COMPLAINT:  Acute encephalopathy  Brief History   Limited history- patient encephalopathic, translation via ICU nurse given BiPAP Kelly Booker is a 69 year old woman from [place] who presents after being the country for 4 days without her home nocturnal supplemental oxygen for COPD.  She presents with shortness of breath that she described as typical of her usual COPD exacerbations.   She is staying with her son.   History of present illness   Limited history- patient encephalopathic, translation via ICU nurse given BiPAP Kelly Booker is a 69 year old woman from [place] who presents after being the country for 4 days without her home nocturnal supplemental oxygen for COPD.  She presents with shortness of breath that she described as typical of her usual COPD exacerbations.  In the room she denies a history of pulmonary disease, tobacco use, or home inhaler or oxygen use.  Per ED documentation she uses 2.5 L of oxygen at night when she is at home.  Her medication names are in [place] but just thought that she is on Spiriva and Xarelto.  Unsure reason why she takes Xarelto.  In the ED she received bronchodilators, Solu-Medrol, and azithromycin. Her son provided history to the admitting [facility] physician she has been intubated for COPD exacerbation past, most recently a year ago.  At that time she was told to the enlarged heart but did not follow-up with cardiology or take her prescribed cardiac medications.  Her son is unaware of the reason for her Xarelto prescription.  Upon admission to try and get her home oxygen she was found to have hypercapnia was placed on BiPAP.  She became more obtunded overnight and repeat ABG demonstrated worsening hypercapnia with PCO2 greater than 100.  She was transferred to the ICU for pending respiratory failure.  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 rate 17, weight 81.2 kg, SpO2 99 %.    FiO2 (%):  [30 %-40 %] 30 %  No intake or output data in the 24 hours ending 12/01/19 0729 Filed Weights   12/01/19 0511  Weight: 81.2 kg    Examination: General: Ill-appearing elderly woman lying in bed in no acute distress HENT: [place]/AT Lungs: Diffuse wheezing and coarse rales bilaterally.  Breathing comfortably on BiPAP. Cardiovascular: Regular rate and rhythm, no murmurs Abdomen: Soft, nondistended Extremities: No clubbing, cyanosis, or edema Neuro: Sleeping, arouses to verbal stimulation, answering yes/no questions with questionable accuracy.  Moving extremities. GU: Pure wick in place  CXR 6/27 personally reviewed-likely left lower lobe opacity, increased interstitial markings and vascularity bilaterally.  Resolved Hospital Problem list     Assessment & Plan:  Acute on chronic hypoxic and hypercapnic respiratory failure due to COPD exacerbation.  Possibly left lower lobe pneumonia.  Low suspicion for pneumonia with normal BNP -Checking procalcitonin, sputum culture, respiratory viral panel -Droplet precautions -Adding ceftriaxone to azithromycin -Solu-Medrol 60 mg daily -DuoNebs every 4 hours plus albuterol every  2 as needed -Continue BiPAP with close monitoring.  Adjusted mask with improvement in tidal volumes.  We will repeat ABG later this morning. -Titrate FiO2 to maintain SPO2 greater than 88%.  Hyperkalemia, possibly  due to acidosis -Recheck BMP now  Hyperglycemia -Accu-Cheks every 4 hours with sliding scale insulin -Checking A1c  Chronic anticoagulation; D-dimer WNL.  Mildly elevated INR suggestive of compliance with chronic DOAC. -lovenox BID  Polycythemia- Suggestive of chronic hypoxia -Continue to monitor  Best practice:  Diet: NPO Pain/Anxiety/Delirium protocol (if indicated): n/a VAP protocol (if indicated): n/a DVT prophylaxis: lovenox GI prophylaxis: pantoprazole Glucose control: SSI Mobility: bedrest Code Status: full Family Communication: attempted to call family at 1 number in chart, L/M Disposition: ICU  Labs   CBC: Recent Labs  Lab 11/30/19 2040 12/01/19 0014 12/01/19 0136  WBC 7.6  --  9.6  NEUTROABS 4.6  --   --   HGB 15.3* 16.7* 16.3*  HCT 52.3* 49.0* 55.2*  MCV 103.8*  --  102.6*  PLT 200  --  517    Basic Metabolic Panel: Recent Labs  Lab 11/30/19 2040 12/01/19 0014 12/01/19 0136  NA 142 131* 140  K 5.1 4.1 5.2*  CL 101  --  94*  CO2 32  --  35*  GLUCOSE 116*  --  170*  BUN 20  --  20  CREATININE 1.03*  --  1.02*  CALCIUM 8.6*  --  9.2   GFR: CrCl cannot be calculated (Unknown ideal weight.). Recent Labs  Lab 11/30/19 2040 12/01/19 0136  WBC 7.6 9.6    Liver Function Tests: No results for input(s): AST, ALT, ALKPHOS, BILITOT, PROT, ALBUMIN in the last 168 hours. No results for input(s): LIPASE, AMYLASE in the last 168 hours. No results for input(s): AMMONIA in the last 168 hours.  ABG    Component Value Date/Time   PHART 7.201 (L) 12/01/2019 0625   PCO2ART 105 (HH) 12/01/2019 0625   PO2ART 89.3 12/01/2019 0625   HCO3 39.7 (H) 12/01/2019 0625   TCO2 45 (H) 12/01/2019 0014   O2SAT 95.2 12/01/2019 0625     Coagulation Profile: Recent Labs  Lab 12/01/19 0136  INR 1.6*    Cardiac Enzymes: No results for input(s): CKTOTAL, CKMB, CKMBINDEX, TROPONINI in the last 168 hours.  HbA1C: No results found for: HGBA1C  CBG: Recent  Labs  Lab 12/01/19 0525  GLUCAP 170*    Review of Systems:   Unable to obtain due to encephalopathy  Past Medical History  She,  has a past medical history of COPD (chronic obstructive pulmonary disease) ([place]).   Surgical History   History reviewed. No pertinent surgical history.  Unknown due to encephalopathy   Social History    Unknown due to encephalopathy  Family History   Her family history is not on file.  Unknown due to encephalopathy   Allergies No Known Allergies   Home Medications  Prior to Admission medications   Medication Sig Start Date End Date Taking? Authorizing Provider  OXYGEN Inhale 2.5-3 L into the lungs continuous.    Yes [provider]  PRESCRIPTION MEDICATION Take 1,000 mg by mouth daily at 2 PM. "[place]on" from [facility]   Yes [provider]  PRESCRIPTION MEDICATION Take 25 mg by mouth daily. "Spirotard" from [place]   Yes [provider]  PRESCRIPTION MEDICATION Take 12 mcg by mouth daily. "Aerofor" from [place] -  inhale the contents of one capsule (12 mcg) into the lungs once daily   Yes  [provider]  rivaroxaban (XARELTO) 20 MG TABS tablet Take 20 mg by mouth every morning.   Yes [provider]  tiotropium (SPIRIVA) 18 MCG inhalation capsule Place 18 mcg into inhaler and inhale daily.   Yes [provider]     This patient is critically ill with multiple organ system failure which requires frequent high complexity decision making, assessment, support, evaluation, and titration of therapies. This was completed through the application of advanced monitoring technologies and extensive interpretation of multiple databases. During this encounter critical care time was devoted to patient care services described in this note for 40 minutes.  Julian Hy, DO 12/01/19 8:00 AM [place]

## 2019-12-01 NOTE — Progress Notes (Signed)
PT Cancellation Note  Patient Details Name: Kelly Booker MRN: [id number] DOB: [birth year]   Cancelled Treatment:    Reason Eval/Treat Not Completed: Medical issues which prohibited therapy;Patient not medically ready  Chart review - Rapid Response event with patient with decreased LOC, placed on bipap and transferred to ICU. Will hold current therapies. Will follow-up on 12/02/19.   Milana Na, PT, DPT Supplemental Physical Therapist 12/01/19 7:42 AM Office: [phone]

## 2019-12-01 NOTE — Progress Notes (Signed)
Called by RN due to increase somnolence Abg ordered, note worsening acidosis and hypercarbia. Rapid response team called and transitioned patient to ICU   Called son Cadi Rhinehart [phone]  To give update on patient condition   He was able to provide more history he states  That his mother has been intubated twice with prior COPD exacerbation.  The last being one 1 year ago  In [place]-  He states at that time it was found that patient had an enlarge heart  He states she was prescribed medications for this and referred to a Cardiologist did not follow up or take the prescribed medications.  He however is unaware for the diagnosis that is associated with her use of anticoagulation.

## 2019-12-01 NOTE — Progress Notes (Addendum)
Patient seems more lethargic. Patient is on Bipap 40%. Only sternal rub to wake her up and she is back to sleep. Vital signs are stable. Afebrile.   Cardiac monitor shows irregular rhythm between 58 and 92. EKG performed and it shows sinus arrhythmia.   CBG is 170. Charge nurse, Estill Bamberg and [facility] Admission MD Marcello Moores notified - ABG order received. Will continue to assess.

## 2019-12-01 NOTE — Progress Notes (Signed)
CRITICAL VALUE ALERT  Critical Value:  ABG: pCO2: 105   Date & Time Notied:  12/01/2019 at [place]   Provider Notified: Dr. Marcello Moores  Orders Received/Actions taken: Rapid response team at bedside.

## 2019-12-01 NOTE — Plan of Care (Signed)
Family updated at bedside. Patient much more alert- eyes open, trying to speak in full sentences.  ABG    Component Value Date/Time   PHART 7.322 (L) 12/01/2019 1143   PCO2ART 80.1 (HH) 12/01/2019 1143   PO2ART 68 (L) 12/01/2019 1143   HCO3 41.4 (H) 12/01/2019 1143   TCO2 44 (H) 12/01/2019 1143   O2SAT 90.0 12/01/2019 1143     Ok for some time off BiPAP, likely will need to go back on this afternoon. Con't q4h nebs.  Julian Hy, DO 12/01/19 1:27 PM [place] [facility]

## 2019-12-01 NOTE — Progress Notes (Signed)
Pt removed from BIPAP and placed on 4L [place] per MD request. Pt is tolerating well at this time.

## 2019-12-02 ENCOUNTER — Inpatient Hospital Stay (HOSPITAL_COMMUNITY): Payer: Self-pay

## 2019-12-02 DIAGNOSIS — I509 Heart failure, unspecified: Secondary | ICD-10-CM

## 2019-12-02 LAB — GLUCOSE, CAPILLARY
Glucose-Capillary: 136 mg/dL — ABNORMAL HIGH (ref 70–99)
Glucose-Capillary: 107 mg/dL — ABNORMAL HIGH (ref 70–99)
Glucose-Capillary: 122 mg/dL — ABNORMAL HIGH (ref 70–99)
Glucose-Capillary: 147 mg/dL — ABNORMAL HIGH (ref 70–99)

## 2019-12-02 LAB — ECHOCARDIOGRAM COMPLETE: Weight: 2836 oz

## 2019-12-02 LAB — PROCALCITONIN: Procalcitonin: <0.1 ng/mL

## 2019-12-02 MED ORDER — RIVAROXABAN 20 MG PO TABS
20.0000 mg | ORAL_TABLET | Freq: Every morning | ORAL | Status: DC
  Administered 2019-12-02 – 2019-12-04 (×3): 20 mg via ORAL
  Filled 2019-12-02 (×3): qty 1

## 2019-12-02 MED ORDER — INSULIN ASPART 100 UNIT/ML ~~LOC~~ SOLN
0.0000 [IU] | Freq: Three times a day (TID) | SUBCUTANEOUS | Status: DC
  Administered 2019-12-02: 3 [IU] via SUBCUTANEOUS
  Administered 2019-12-02: 7 [IU] via SUBCUTANEOUS
  Administered 2019-12-03: 4 [IU] via SUBCUTANEOUS
  Administered 2019-12-04 (×2): 3 [IU] via SUBCUTANEOUS
  Administered 2019-12-05: 4 [IU] via SUBCUTANEOUS
  Administered 2019-12-05 – 2019-12-07 (×3): 3 [IU] via SUBCUTANEOUS
  Administered 2019-12-08: 7 [IU] via SUBCUTANEOUS
  Administered 2019-12-09: 3 [IU] via SUBCUTANEOUS

## 2019-12-02 MED ORDER — POLYETHYLENE GLYCOL 3350 17 G PO PACK
17.0000 g | PACK | Freq: Every day | ORAL | Status: DC
  Administered 2019-12-02 – 2019-12-07 (×7): 17 g via ORAL
  Filled 2019-12-02 (×8): qty 1

## 2019-12-02 MED ORDER — FUROSEMIDE 20 MG PO TABS
20.0000 mg | ORAL_TABLET | Freq: Every day | ORAL | Status: DC
  Administered 2019-12-02: 20 mg via ORAL
  Filled 2019-12-02: qty 1

## 2019-12-02 MED ORDER — INSULIN ASPART 100 UNIT/ML ~~LOC~~ SOLN: 0.0000 [IU] | Freq: Every day | SUBCUTANEOUS | Status: DC

## 2019-12-02 MED ORDER — PERFLUTREN LIPID MICROSPHERE
1.0000 mL | INTRAVENOUS | Status: AC | PRN
  Administered 2019-12-02: 2 mL via INTRAVENOUS
  Filled 2019-12-02: qty 10

## 2019-12-02 MED ORDER — DOCUSATE SODIUM 100 MG PO CAPS: 100.0000 mg | ORAL_CAPSULE | Freq: Two times a day (BID) | ORAL | Status: DC | PRN

## 2019-12-02 MED ORDER — PREDNISONE 20 MG PO TABS
40.0000 mg | ORAL_TABLET | Freq: Every day | ORAL | Status: DC
  Administered 2019-12-02: 40 mg via ORAL
  Filled 2019-12-02: qty 2

## 2019-12-02 NOTE — Evaluation (Addendum)
Occupational Therapy Evaluation Patient Details Name: Kelly Booker MRN: [id number] DOB: Nov 15, 1950 Today's Date: 12/02/2019    History of Present Illness 69 yo Spanish speaking female arrived from [place] 4 days prior to admission with hx of COPD and hypoxia presented to ER because she didn't have oxygen set up and had increased dyspnea.  Admitted by hospitalist with COPD exacerbation and acute on chronic hypoxic/hypercapnic respiratory failure.  Developed altered mental status 6/27 and transferred to ICU for Bipap therapy.   Clinical Impression   PTA, pt was living in [place], pt was independent with ADL/IADL and functional mobility. Pt was on 2.5lnc at baseline. Pt will be staying with her son in a second level apartment. Pt currently requires minguard to stand from lower surface toilet. She requires supervision-minguard during functional mobility without AD. Pt on 4lnc throughout session, SpO2 83%-89% during functional mobility and activity. Pt required standing rest break with pursed lip breathing. Due to decline in current level of function, pt would benefit from acute OT to address established goals to facilitate safe D/C to venue listed below. Will continue to follow acutely.     Follow Up Recommendations  No OT follow up;Supervision - Intermittent    Equipment Recommendations  3 in 1 bedside commode    Recommendations for Other Services       Precautions / Restrictions Precautions Precautions: Fall Precaution Comments: watch O2 Restrictions Weight Bearing Restrictions: No      Mobility Bed Mobility               General bed mobility comments: pt sitting in recliner upon arrival  Transfers Overall transfer level: Needs assistance Equipment used: None Transfers: Sit to/from Stand Sit to Stand: Min guard         General transfer comment: minguard from low surface toilet, supervision to stand from recliner    Balance Overall balance assessment: Mild deficits  observed, not formally tested                                         ADL either performed or assessed with clinical judgement   ADL Overall ADL's : Needs assistance/impaired Eating/Feeding: Set up;Sitting   Grooming: Supervision/safety;Standing   Upper Body Bathing: Supervision/ safety;Sitting   Lower Body Bathing: Min guard;Sit to/from stand   Upper Body Dressing : Modified independent;Sitting   Lower Body Dressing: Min guard;Sit to/from stand   Toilet Transfer: Min guard;Ambulation Toilet Transfer Details (indicate cue type and reason): pt ambulated to toilet, sat on low commode, minguard for safety Toileting- Clothing Manipulation and Hygiene: Min guard;Sit to/from stand Toileting - Clothing Manipulation Details (indicate cue type and reason): from low surface     Functional mobility during ADLs: Min guard General ADL Comments: minguard, pt pulling her own O2 take;minguard for safety as pt is asymptomatic with decreased O2 levels;pt on 4lnc during activity, SpO2 83%-89%;cues for rest breaks and pursed lip breathing     Vision Baseline Vision/History: Wears glasses Wears Glasses: Reading only Patient Visual Report: No change from baseline Vision Assessment?: No apparent visual deficits     Perception     Praxis      Pertinent Vitals/Pain Pain Assessment: No/denies pain     Hand Dominance Right   Extremity/Trunk Assessment Upper Extremity Assessment Upper Extremity Assessment: Overall WFL for tasks assessed   Lower Extremity Assessment Lower Extremity Assessment: Overall WFL for tasks assessed   Cervical /  Trunk Assessment Cervical / Trunk Assessment: Normal   Communication Communication Communication: Prefers language other than English;Interpreter utilized   Cognition Arousal/Alertness: Awake/alert Behavior During Therapy: WFL for tasks assessed/performed Overall Cognitive Status: Within Functional Limits for tasks assessed                                  General Comments: pt oriented, demonstrated good awareness of safety and of deficits, pt asymptomatic with decreased O2 levels, required cues to take frequent rest breaks   General Comments  pt on 4lnc throughout session, SpO2 83%-89% with activity Pt's sons translating during session.    Exercises     Shoulder Instructions      Home Living Family/patient expects to be discharged to:: Private residence Living Arrangements: Children Available Help at Discharge: Family;Available 24 hours/day Type of Home: Apartment Home Access: Stairs to enter [employer] of Steps: flight   Home Layout: One level     Bathroom Shower/Tub: Teacher, early years/pre: Standard     Home Equipment: None   Additional Comments: pt plans to d/c to her son's apartment, which is on the second level      Prior Functioning/Environment Level of Independence: Independent        Comments: pt was on 2.5lnc at baseline;        OT Problem List: Decreased activity tolerance;Cardiopulmonary status limiting activity      OT Treatment/Interventions: Self-care/ADL training;Therapeutic exercise;Energy conservation;DME and/or AE instruction;Therapeutic activities;Patient/family education;Balance training    OT Goals(Current goals can be found in the care plan section) Acute Rehab OT Goals Patient Stated Goal: to get stronger  OT Goal Formulation: With patient/family Time For Goal Achievement: 12/16/19 Potential to Achieve Goals: Good ADL Goals Pt Will Perform Grooming: with modified independence;standing Pt Will Perform Lower Body Dressing: with modified independence;sit to/from stand Pt Will Transfer to Toilet: with modified independence;ambulating Additional ADL Goal #1: Pt will demonstrate independence with 3 energy conservation strategies during ADL completion.  OT Frequency: Min 2X/week   Barriers to D/C: Inaccessible home environment  pt  will be staying on the second floor of apartment complex       Co-evaluation PT/OT/SLP Co-Evaluation/Treatment: Yes Reason for Co-Treatment: For patient/therapist safety;To address functional/ADL transfers   OT goals addressed during session: ADL's and self-care      AM-PAC OT "6 Clicks" Daily Activity     Outcome Measure Help from another person eating meals?: None Help from another person taking care of personal grooming?: A Little Help from another person toileting, which includes using toliet, bedpan, or urinal?: A Little Help from another person bathing (including washing, rinsing, drying)?: A Little Help from another person to put on and taking off regular upper body clothing?: None Help from another person to put on and taking off regular lower body clothing?: A Little 6 Click Score: 20   End of Session Equipment Utilized During Treatment: Gait belt;Oxygen Nurse Communication: Mobility status  Activity Tolerance: Patient tolerated treatment well Patient left: in chair;with call bell/phone within reach;with family/visitor present  OT Visit Diagnosis: Other abnormalities of gait and mobility (R26.89)                Time: 1228-1300 OT Time Calculation (min): 32 min Charges:  OT General Charges $OT Visit: 1 Visit OT Evaluation $OT Eval Moderate Complexity: [place] OTR/L Acute Rehabilitation Services Office: [place] 12/02/2019, 2:51 PM

## 2019-12-02 NOTE — Progress Notes (Signed)
  Echocardiogram 2D Echocardiogram has been performed.  Bobbye Charleston 12/02/2019, 9:53 AM

## 2019-12-02 NOTE — Progress Notes (Addendum)
NAME:  Kelly Booker, MRN:  [id number], DOB:  04/20/1951, LOS: 2 ADMISSION DATE:  11/30/2019, CONSULTATION DATE:  12/01/19 REFERRING MD:  Marcello Moores, CHIEF COMPLAINT:  Acute metabolic encephalopathy from hypercapnia  Brief History   69 yo Spanish speaking female arrived from [place] 4 days prior to admission with hx of COPD and hypoxia presented to ER because she didn't have oxygen set up and had increased dyspnea.  Admitted by hospitalist with COPD exacerbation and acute on chronic hypoxic/hypercapnic respiratory failure.  Developed altered mental status 6/27 and transferred to ICU for Bipap therapy.  Past Medical History  COPD, chronic hypoxic/hypercapnic respiratory failure, thromb-embolic disease on [place] [facility] Events   6/26 Admit 6/27 Transfer to ICU, start Bipap 6/28 Transfer to progressive care  Consults:    Procedures:    Significant Diagnostic Tests:  Echo 6/28 >>   Micro Data:  Respiratory viral panel 6/27 >> negative  Antimicrobials:  Rocephin 6/26 >>  Zithromax 6/26 >>   Interim history/subjective:  Interview done with Spanish interpretor electronically.  Feels better.  Not having cough, wheeze, chest congestion, chest pain, nausea, sinus congestion, sore throat.  C/o constipation.  Objective   Blood pressure (!) 92/53, pulse 65, temperature (!) 97.4 F (36.3 C), temperature source Axillary, resp. rate 17, weight 80.4 kg, SpO2 94 %.    FiO2 (%):  [35 %-100 %] 100 %   Intake/Output Summary (Last 24 hours) at 12/02/2019 0841 Last data filed at 12/02/2019 0400 Gross per 24 hour  Intake 210 ml  Output 800 ml  Net -590 ml   Filed Weights   12/01/19 0511 12/02/19 0421  Weight: 81.2 kg 80.4 kg    Examination:  General - alert Eyes - pupils reactive ENT - no sinus tenderness, no stridor Cardiac - regular rate/rhythm, no murmur Chest - equal breath sounds b/l, no wheezing or rales Abdomen - soft, non tender, + bowel sounds Extremities - no  cyanosis, clubbing, or edema Skin - no rashes Neuro - normal strength, moves extremities, follows commands Psych - normal mood and behavior  Resolved Hospital Problem list   Pneumonia ruled out, Hyperkalemia from acidosis  Assessment & Plan:   Acute on chronic hypoxic/hypercapnic respiratory failure. - from COPD exacerbation - also concerned she could have OSA/OHS - day 3/5 of zithromax - d/c rocephin - change to prednisone 40 mg daily and wean off as tolerated over next 5 to 7 days - continue duoneb with prn albuterol - Bipap qhs and prn - goal SpO2 90 to 95% - f/u CXR as needed - will need sleep study as outpt - lasix 20 mg daily  History of thromboembolic disease. - f/u Echo - resume outpt xarelto  DM type II poorly controlled with steroid induced hyperglycemia. - HbA1C 7 from 12/01/19 - SSI  Polycythemia. - likely secondary in setting of hypoxia - f/u CBC  Constipation. - adjust bowel regimen  Deconditioning. - PT/OT assessment  Best practice:  Diet: carb modified, heart healthy DVT prophylaxis: xarelto GI prophylaxis: pantoprazole Glucose control: not indicated Mobility: as tolerated Code Status: full Disposition: progressive care.  Transfer to [facility] 6/29 and PCCM off.  Labs:   CMP Latest Ref Rng & Units 12/01/2019 12/01/2019 12/01/2019  Glucose 70 - 99 mg/dL - 172(H) 170(H)  BUN 8 - 23 mg/dL - 22 20  Creatinine 0.44 - 1.00 mg/dL - 1.18(H) 1.02(H)  Sodium 135 - 145 mmol/L 143 141 140  Potassium 3.5 - 5.1 mmol/L 5.1 5.7(H) 5.2(H)  Chloride 98 -  111 mmol/L - 95(L) 94(L)  CO2 22 - 32 mmol/L - 36(H) 35(H)  Calcium 8.9 - 10.3 mg/dL - 9.1 9.2    CBC Latest Ref Rng & Units 12/01/2019 12/01/2019 12/01/2019  WBC 4.0 - 10.5 K/uL - 9.6 -  Hemoglobin 12.0 - 15.0 g/dL 18.7(H) 16.3(H) 16.7(H)  Hematocrit 36 - 46 % 55.0(H) 55.2(H) 49.0(H)  Platelets 150 - 400 K/uL - 216 -    ABG    Component Value Date/Time   PHART 7.322 (L) 12/01/2019 1143   PCO2ART 80.1  (HH) 12/01/2019 1143   PO2ART 68 (L) 12/01/2019 1143   HCO3 41.4 (H) 12/01/2019 1143   TCO2 44 (H) 12/01/2019 1143   O2SAT 90.0 12/01/2019 1143    CBG (last 3)  Recent Labs    12/01/19 2356 12/02/19 0350 12/02/19 0725  GLUCAP 131* 136* 107*    Lab Results  Component Value Date   TSH 2.401 12/01/2019    Signature:  Chesley Mires, MD [place] Pager - [phone] 12/02/2019, 8:54 AM

## 2019-12-02 NOTE — Evaluation (Signed)
Physical Therapy Evaluation Patient Details Name: Kelly Booker MRN: [id number] DOB: 08/25/1950 Today's Date: 12/02/2019   History of Present Illness  69 yo Spanish speaking female arrived from [place] 4 days prior to admission with hx of COPD and hypoxia presented to ER because she didn't have oxygen set up and had increased dyspnea.  Admitted by hospitalist with COPD exacerbation and acute on chronic hypoxic/hypercapnic respiratory failure.  Developed altered mental status 6/27 and transferred to ICU for Bipap therapy.  Clinical Impression  Pt admitted with/for increased dyspnea/COPD exacerbation.  Pt is likely not at baseline function/stamina, but is limited mostly by oxygen need..  Pt currently limited functionally due to the problems listed below.  (see problems list.)  Pt will benefit from PT to maximize function and safety to be able to get home safely with available assist .     Follow Up Recommendations No PT follow up    Equipment Recommendations  None recommended by PT    Recommendations for Other Services       Precautions / Restrictions Precautions Precautions: Fall Precaution Comments: watch O2 Restrictions Weight Bearing Restrictions: No      Mobility  Bed Mobility               General bed mobility comments: pt sitting in recliner upon arrival  Transfers Overall transfer level: Needs assistance Equipment used: None Transfers: Sit to/from Stand Sit to Stand: Min guard         General transfer comment: minguard from low surface toilet, supervision to stand from recliner  Ambulation/Gait Ambulation/Gait assistance: Min guard Gait Distance (Feet): 150 Feet Assistive device: None (rolling the portable O2 tank) Gait Pattern/deviations: Step-through pattern     General Gait Details: generally steady, even having to manage the portable O2 tank.  Sats on RA fell to 83% and came back to 91% on 4L  Stairs            Wheelchair Mobility     Modified Rankin (Stroke Patients Only)       Balance Overall balance assessment: Mild deficits observed, not formally tested                                           Pertinent Vitals/Pain Pain Assessment: No/denies pain    Home Living Family/patient expects to be discharged to:: Private residence Living Arrangements: Children Available Help at Discharge: Family;Available 24 hours/day Type of Home: Apartment Home Access: Stairs to enter   Entrance Stairs-Number of Steps: flight Home Layout: One level Home Equipment: None Additional Comments: pt plans to d/c to her son's apartment, which is on the second level    Prior Function Level of Independence: Independent         Comments: pt was on 2.5lnc at baseline;     Hand Dominance   Dominant Hand: Right    Extremity/Trunk Assessment   Upper Extremity Assessment Upper Extremity Assessment: Overall WFL for tasks assessed    Lower Extremity Assessment Lower Extremity Assessment: Overall WFL for tasks assessed    Cervical / Trunk Assessment Cervical / Trunk Assessment: Normal  Communication   Communication: Prefers language other than [place];Interpreter utilized  Cognition Arousal/Alertness: Awake/alert Behavior During Therapy: WFL for tasks assessed/performed Overall Cognitive Status: Within Functional Limits for tasks assessed  General Comments: pt oriented, demonstrated good awareness of safety and of deficits, pt asymptomatic with decreased O2 levels, required cues to take frequent rest breaks      General Comments General comments (skin integrity, edema, etc.): pt on 4lnc throughout session, SpO2 83%-89% with activity    Exercises     Assessment/Plan    PT Assessment Patient needs continued PT services  PT Problem List Decreased activity tolerance;Decreased mobility;Cardiopulmonary status limiting activity       PT Treatment  Interventions Gait training;Stair training;Functional mobility training;Therapeutic activities;Patient/family education    PT Goals (Current goals can be found in the Care Plan section)  Acute Rehab PT Goals Patient Stated Goal: to get stronger  PT Goal Formulation: With patient Time For Goal Achievement: 12/09/19 Potential to Achieve Goals: Good    Frequency Min 3X/week   Barriers to discharge        Co-evaluation PT/OT/SLP Co-Evaluation/Treatment: Yes Reason for Co-Treatment: For patient/therapist safety PT goals addressed during session: Mobility/safety with mobility OT goals addressed during session: ADL's and self-care       AM-PAC PT "6 Clicks" Mobility  Outcome Measure Help needed turning from your back to your side while in a flat bed without using bedrails?: A Little Help needed moving from lying on your back to sitting on the side of a flat bed without using bedrails?: A Little Help needed moving to and from a bed to a chair (including a wheelchair)?: A Little Help needed standing up from a chair using your arms (e.g., wheelchair or bedside chair)?: A Little Help needed to walk in hospital room?: A Little Help needed climbing 3-5 steps with a railing? : A Little 6 Click Score: 18    End of Session Equipment Utilized During Treatment: Oxygen Activity Tolerance: Patient tolerated treatment well Patient left: in chair;with call bell/phone within reach;with chair alarm set;with family/visitor present Nurse Communication: Mobility status PT Visit Diagnosis: Other abnormalities of gait and mobility (R26.89);Difficulty in walking, not elsewhere classified (R26.2)    Time: 1228-1300 PT Time Calculation (min) (ACUTE ONLY): 32 min   Charges:   PT Evaluation $PT Eval Moderate Complexity: 1 Mod          12/02/2019  Ginger Carne., PT Acute Rehabilitation Services [phone]  (pager) [phone]  (office)  Tessie Fass Oshay Stranahan 12/02/2019, 5:50 PM

## 2019-12-02 NOTE — Progress Notes (Signed)
Report called to Vicente Males, RN on 5W. All questions answered. Pt transferred to 5W32 via wheelchair.

## 2019-12-02 NOTE — Progress Notes (Signed)
SATURATION QUALIFICATIONS: (This note is used to comply with regulatory documentation for home oxygen)  Patient Saturations on Room Air at Rest = 86%  Patient Saturations on Room Air while Ambulating = 83%  Patient Saturations on 4 Liters of oxygen while Ambulating = 91%  Please briefly explain why patient needs home oxygen:   Without supplemental oxygen right now, pt's sats can not be maintained at adequate levels at rest or with exertion. 12/02/2019  Ginger Carne., PT Acute Rehabilitation Services [phone]  (pager) [phone]  (office)

## 2019-12-03 DIAGNOSIS — R0989 Other specified symptoms and signs involving the circulatory and respiratory systems: Secondary | ICD-10-CM

## 2019-12-03 DIAGNOSIS — R0602 Shortness of breath: Secondary | ICD-10-CM

## 2019-12-03 LAB — BASIC METABOLIC PANEL
Anion gap: 10 (ref 5–15)
BUN: 41 mg/dL — ABNORMAL HIGH (ref 8–23)
CO2: 39 mmol/L — ABNORMAL HIGH (ref 22–32)
Calcium: 8.9 mg/dL (ref 8.9–10.3)
Chloride: 94 mmol/L — ABNORMAL LOW (ref 98–111)
Creatinine, Ser: 1.09 mg/dL — ABNORMAL HIGH (ref 0.44–1.00)
GFR calc Af Amer: >60 mL/min (ref >60)
GFR calc non Af Amer: 52 mL/min — ABNORMAL LOW (ref >60)
Glucose, Bld: 103 mg/dL — ABNORMAL HIGH (ref 70–99)
Potassium: 4.1 mmol/L (ref 3.5–5.1)
Sodium: 143 mmol/L (ref 135–145)

## 2019-12-03 LAB — CBC
HCT: 51.1 % — ABNORMAL HIGH (ref 36.0–46.0)
Hemoglobin: 15.6 g/dL — ABNORMAL HIGH (ref 12.0–15.0)
MCH: 30.2 pg (ref 26.0–34.0)
MCHC: 30.5 g/dL (ref 30.0–36.0)
MCV: 99 fL (ref 80.0–100.0)
Platelets: 222 10*3/uL (ref 150–400)
RBC: 5.16 MIL/uL — ABNORMAL HIGH (ref 3.87–5.11)
RDW: 17.2 % — ABNORMAL HIGH (ref 11.5–15.5)
WBC: 9 10*3/uL (ref 4.0–10.5)
nRBC: 0 % (ref 0.0–0.2)

## 2019-12-03 LAB — BRAIN NATRIURETIC PEPTIDE: B Natriuretic Peptide: 26.9 pg/mL (ref 0.0–100.0)

## 2019-12-03 LAB — GLUCOSE, CAPILLARY
Glucose-Capillary: 202 mg/dL — ABNORMAL HIGH (ref 70–99)
Glucose-Capillary: 91 mg/dL (ref 70–99)
Glucose-Capillary: 98 mg/dL (ref 70–99)
Glucose-Capillary: 192 mg/dL — ABNORMAL HIGH (ref 70–99)
Glucose-Capillary: 116 mg/dL — ABNORMAL HIGH (ref 70–99)

## 2019-12-03 MED ORDER — PANTOPRAZOLE SODIUM 40 MG PO TBEC
40.0000 mg | DELAYED_RELEASE_TABLET | Freq: Every day | ORAL | Status: DC
  Administered 2019-12-03 – 2019-12-10 (×8): 40 mg via ORAL
  Filled 2019-12-03 (×8): qty 1

## 2019-12-03 MED ORDER — PREDNISONE 20 MG PO TABS
30.0000 mg | ORAL_TABLET | Freq: Every day | ORAL | Status: DC
  Administered 2019-12-03 – 2019-12-07 (×5): 30 mg via ORAL
  Filled 2019-12-03 (×5): qty 1

## 2019-12-03 MED ORDER — LIVING WELL WITH DIABETES BOOK - IN SPANISH
Freq: Once | Status: AC
  Filled 2019-12-03: qty 1

## 2019-12-03 MED ORDER — IPRATROPIUM-ALBUTEROL 0.5-2.5 (3) MG/3ML IN SOLN
3.0000 mL | Freq: Two times a day (BID) | RESPIRATORY_TRACT | Status: DC
  Administered 2019-12-03 – 2019-12-07 (×8): 3 mL via RESPIRATORY_TRACT
  Filled 2019-12-03 (×9): qty 3

## 2019-12-03 MED ORDER — LACTATED RINGERS IV SOLN: INTRAVENOUS | Status: DC

## 2019-12-03 NOTE — Progress Notes (Signed)
Pt's BiPAP settings are IPAP: 18 EPAP: 8 RR: 10 FiO2: 50%  Please refer to BiPAP/CPAP flow sheet for all measurements and settings.

## 2019-12-03 NOTE — Progress Notes (Signed)
Inpatient Diabetes Program Recommendations  AACE/ADA: New Consensus Statement on Inpatient Glycemic Control   Target Ranges:  Prepandial:   less than 140 mg/dL      Peak postprandial:   less than 180 mg/dL (1-2 hours)      Critically ill patients:  140 - 180 mg/dL   Results for RUDINE, RIEGER (MRN [id number]) as of 12/03/2019 14:10  Ref. Range 12/02/2019 07:25 12/02/2019 11:24 12/02/2019 17:11 12/02/2019 21:09 12/03/2019 07:51 12/03/2019 11:36  Glucose-Capillary Latest Ref Range: 70 - 99 mg/dL 107 (H) 122 (H) 202 (H) 147 (H) 91 98  Results for JANEEN, WATSON (MRN [id number]) as of 12/03/2019 14:10  Ref. Range 12/01/2019 08:15  Hemoglobin A1C Latest Ref Range: 4.8 - 5.6 % 7.0 (H)   Review of Glycemic Control  Diabetes history: NO Outpatient Diabetes medications: NA Current orders for Inpatient glycemic control: Novolog 0-20 units TID with meals; Prednisone 30 mg QAM  NOTE: Noted consult for Diabetes Coordinator for education regarding new DM dx. Spoke with patient, her son, and daughter in law about new diabetes diagnosis. Patient's son speaks fluent English and was able to translate. Patient is able to read Spanish.  Patient is here in [place] visiting her other son from [place] for a few weeks.  Patient is unsure exactly how long she will stay in [place]. Patient has a PCP in [place]. Informed patient that it was noted in the chart that [facility] is planning to have her follow up at [facility] and will be providing [facility] voucher for discharge medications. Patient is very appreciative of care and assistance she has been given. Patient has not had any blood work done lately and no prior hx of DM.   Discussed A1C results (7% on 12/01/19) and explained what an A1C is and informed patient that her current A1C indicates an average glucose of 154 mg/dl over the past 2-3 months. Discussed basic pathophysiology of DM Type 2, basic home care, importance of checking CBGs and maintaining good CBG control to  prevent long-term and short-term complications. Reviewed glucose and A1C goals.   Discussed impact of nutrition, exercise, stress, sickness, and medications on diabetes control. Patient has a sweet tooth and frequently eats desserts. Discussed Carb Modified diet and encouraged patient to eat carbohydrates in moderation. Provided handout on carb modified diet as well.  Discussed impact steroids have on glucose and explained that while on steroids, glucose is likely to be higher.   Informed patient that she would be receiving a Living Well with diabetes booklet (Spanish) and encouraged patient and family to read through entire book to learn more about DM.  Informed patient about going to [facility] to get the Reli-On Prime glucometer for $9 and a box of 50 Reli-On test strips for $9 if MD wants patient to monitor glucose outpatient.  Informed patient that per MD notes, she will likely be prescribed oral DM medication.  Patient verbalized understanding of information discussed and she states that she has no further questions at this time related to diabetes.   RNs to provide ongoing basic DM education at bedside with this patient and family.  Thanks, Barnie Alderman, RN, MSN, CDE Diabetes Coordinator Inpatient Diabetes Program [phone] (Team Pager from 8am to 5pm)

## 2019-12-03 NOTE — TOC Initial Note (Signed)
Transition of Care [facility]) - Initial/Assessment Note    Patient Details  Name: Kelly Booker MRN: [id number] Date of Birth: [birth year]  Transition of Care [facility] CM/SW Contact:    Carles Collet, RN Phone Number: 12/03/2019, 3:33 PM  Clinical Narrative:        Damaris Schooner w patient and son Kelly Booker and daughter in law Kelly Booker at bedside. Kelly Booker and Kelly Booker brought patient to [place] from [place] a few days ago. The patient was not wearing her CPAP at home consistently and forgot to pack it. She reported wearing her O2 PRN at home prior to coming.   They did not have travel oxygen at the airport and were not concerned stating that they planned to get her some oxygen when they got here because they thought she could go a day or two without needing it. They were unable to get her oxygen, and she ended up admitted w COPD.   Patient will DC to son Kelly Booker   [phone] [place] [address] Kelly Booker agreed to pay out of pocket for home oxygen, Kelly Booker w [employer] to call and set this up.  Kelly Booker will work on getting the CPAP sent here via [place].  RT to place note with settings in case we need to get an order for an LOG.  If unable to get via [employer]- self pay is cost prohibitive at $800-900 month. LOG cost is $319 for the first month- this includes the mask, and then $210 a month there after.   TOC will continue to follow.  DC not expected for next 1-2 days.             Expected Discharge Plan: Home/Self Care Barriers to Discharge: Continued Medical Work up   Patient Goals and CMS Choice Patient states their goals for this hospitalization and ongoing recovery are:: will go home w son Kelly Booker to [place] [address]      Expected Discharge Plan and Services Expected Discharge Plan: Home/Self Care   Discharge Planning Services: CM Consult                     DME Arranged: NIV DME Agency: [employer] Date DME Agency Contacted: 12/03/19 Time  DME Agency Contacted: 1205 Representative spoke with at DME Agency: Kelly Booker            Prior Living Arrangements/Services                       Activities of Daily Living Home Assistive Devices/Equipment: Oxygen ADL Screening (condition at time of admission) Patient's cognitive ability adequate to safely complete daily activities?: Yes Is the patient deaf or have difficulty hearing?: No Does the patient have difficulty seeing, even when wearing glasses/contacts?: No Does the patient have difficulty concentrating, remembering, or making decisions?: Yes Patient able to express need for assistance with ADLs?: Yes Does the patient have difficulty dressing or bathing?: No Independently performs ADLs?: Yes (appropriate for developmental age) Does the patient have difficulty walking or climbing stairs?: Yes Weakness of Legs: None Weakness of Arms/Hands: None  Permission Sought/Granted                  Emotional Assessment              Admission diagnosis:  SOB (shortness of breath) [R06.02] COPD exacerbation ([place]) [J44.1] Pulmonary vascular congestion [R09.89] Patient Active Problem List   Diagnosis Date Noted  .  COPD exacerbation ([place]) 11/30/2019   PCP:  Patient, No Pcp Per Pharmacy:   [id number] [address], [place] [address] Phone: [phone] Fax: [place], [place] - [address] [place] [address] Phone: [phone] Fax: [phone]     Social Determinants of Health (SDOH) Interventions    Readmission Risk Interventions No flowsheet data found.

## 2019-12-03 NOTE — Progress Notes (Signed)
PROGRESS NOTE                                                                                                                                                                                                             Patient Demographics:    Kelly Booker, is a 69 y.o. female, DOB - Jan 16, 1951, ONG:[id number]  Admit date - 11/30/2019   Admitting Physician Clance Boll, MD  Outpatient Primary MD for the patient is Patient, No Pcp Per  LOS - 3  Chief Complaint  Patient presents with  . no oxygen tank       Brief Narrative  69 yo Spanish speaking female arrived from [place] 4 days prior to admission with hx of COPD and hypoxia presented to ER because she didn't have oxygen set up and had increased dyspnea.  Admitted by hospitalist with COPD exacerbation and acute on chronic hypoxic/hypercapnic respiratory failure.  Developed altered mental status 6/27 and transferred to ICU for Bipap therapy, he was stabilized and was transferred on 3 L nasal cannula oxygen with nighttime BiPAP to the floor under my service on 12/03/2019.   Subjective:    [facility] today has, No headache, No chest pain, No abdominal pain - No Nausea, No new weakness tingling or numbness, no cough and much improved shortness of breath   Assessment  & Plan :     1.  Acute on chronic hypoxic and hypercapnic respiratory failure due to COPD exacerbation.  Also has likely underlying OSA and OHS with metabolic encephalopathy.  She was admitted to ICU and treated by pulmonary critical care with continuous BiPAP, IV steroids and supplemental oxygen, her mentation has improved, she is now stable on 3 to 4 L nasal cannula oxygen in daytime, still using nighttime BiPAP.  Continue steroid taper and supportive care with nebulizer treatments.  Added I-S and flutter valve.  Echo shows chronic diastolic dysfunction with preserved EF of 60%.  Advance activity.  PT OT evaluation.  2.  History of thromboembolic disease.   On Xarelto.  Echo stable.  3.  Morbid obesity.  BMI greater than 35.  Follow with PCP for weight loss.  4.  DM type II.  Currently on sliding scale.  Stable, most likely upon discharge will require oral hypoglycemic, diabetic education will be ordered.  Lab Results  Component Value Date   HGBA1C 7.0 (H) 12/01/2019   CBG (last 3)  Recent Labs    12/02/19 1124 12/02/19 2109 12/03/19 0751  GLUCAP 122*  147* 91      Condition - Fair  Family Communication  :  None  Code Status :  Full  Consults  :  PCCM  Procedures  :    TTE - 1. Left ventricular ejection fraction, by estimation, is 55 to 60%. The left ventricle has normal function. The left ventricle has no regional wall motion abnormalities. There is mild left ventricular hypertrophy. Left ventricular diastolic parameters are indeterminate.  2. Right ventricular systolic function is normal. The right ventricular size is normal.  3. The mitral valve is grossly normal. Trivial mitral valve regurgitation.  4. The aortic valve is tricuspid. Aortic valve regurgitation is not visualized. Mild aortic valve sclerosis is present, with no evidence of aortic valve stenosis   PUD Prophylaxis : PPI  Disposition Plan  :    Status is: Inpatient  Remains inpatient appropriate because:IV treatments appropriate due to intensity of illness or inability to take PO   Dispo: The patient is from: Home              Anticipated d/c is to: Home              Anticipated d/c date is: 2 days              Patient currently is not medically stable to d/c.  DVT Prophylaxis  : Xarelto  Lab Results  Component Value Date   PLT 222 12/03/2019    Diet :  Diet Order            Diet heart healthy/carb modified Room service appropriate? Yes; Fluid consistency: Thin  Diet effective now                  Inpatient Medications Scheduled Meds: . azithromycin  250 mg Oral Daily  . chlorhexidine  15 mL Mouth Rinse BID  . Chlorhexidine Gluconate  Cloth  6 each Topical Daily  . insulin aspart  0-20 Units Subcutaneous TID WC  . insulin aspart  0-5 Units Subcutaneous QHS  . ipratropium-albuterol  3 mL Nebulization BID  . mouth rinse  15 mL Mouth Rinse q12n4p  . polyethylene glycol  17 g Oral Daily  . predniSONE  30 mg Oral Q breakfast  . rivaroxaban  20 mg Oral q morning - 10a  . sodium chloride flush  3 mL Intravenous Q12H   Continuous Infusions: . lactated ringers 125 mL/hr at 12/03/19 0812   PRN Meds:.albuterol, docusate sodium  Antibiotics  :   Anti-infectives (From admission, onward)   Start     Dose/Rate Route Frequency Ordered Stop   12/01/19 1600  azithromycin (ZITHROMAX) tablet 250 mg     Discontinue    "Followed by" Linked Group Details   250 mg Oral Daily 11/30/19 2355 12/05/19 0959   12/01/19 0830  cefTRIAXone (ROCEPHIN) 2 g in sodium chloride 0.9 % 100 mL IVPB  Status:  Discontinued        2 g 200 mL/hr over 30 Minutes Intravenous Every 24 hours 12/01/19 0735 12/02/19 0900   12/01/19 0000  azithromycin (ZITHROMAX) tablet 500 mg       "Followed by" Linked Group Details   500 mg Oral Daily 11/30/19 2355 12/01/19 0128          Objective:   Vitals:   12/02/19 2344 12/03/19 0303 12/03/19 0314 12/03/19 0846  BP:  123/71    Pulse: 60 64 62 84  Resp: 17 15 16 20   Temp:  97.9 F (36.6 C)  TempSrc:  Axillary    SpO2: 96% 95% 95% 95%  Weight:   80.5 kg     SpO2: 95 % O2 Flow Rate (L/min): 3 L/min FiO2 (%): 36 %  Wt Readings from Last 3 Encounters:  12/03/19 80.5 kg     Intake/Output Summary (Last 24 hours) at 12/03/2019 1101 Last data filed at 12/03/2019 0900 Gross per 24 hour  Intake 45 ml  Output 200 ml  Net -155 ml     Physical Exam  Awake Alert, No new F.N deficits, Normal affect .AT,PERRAL Supple Neck,No JVD, No cervical lymphadenopathy appriciated.  Symmetrical Chest wall movement, Good air movement bilaterally, few wheezes but minimal RRR,No Gallops,Rubs or new Murmurs, No  Parasternal Heave +ve B.Sounds, Abd Soft, No tenderness, No organomegaly appriciated, No rebound - guarding or rigidity. No Cyanosis, Clubbing or edema, No new Rash or bruise       Data Review:    Recent Labs  Lab 11/30/19 2040 12/01/19 0014 12/01/19 0136 12/01/19 1143 12/03/19 0414  WBC 7.6  --  9.6  --  9.0  HGB 15.3* 16.7* 16.3* 18.7* 15.6*  HCT 52.3* 49.0* 55.2* 55.0* 51.1*  PLT 200  --  216  --  222  MCV 103.8*  --  102.6*  --  99.0  MCH 30.4  --  30.3  --  30.2  MCHC 29.3*  --  29.5*  --  30.5  RDW 18.0*  --  17.9*  --  17.2*  LYMPHSABS 2.2  --   --   --   --   MONOABS 0.7  --   --   --   --   EOSABS 0.1  --   --   --   --   BASOSABS 0.0  --   --   --   --     Recent Labs  Lab 11/30/19 2040 11/30/19 2040 12/01/19 0014 12/01/19 0136 12/01/19 0815 12/01/19 1143 12/02/19 0233 12/03/19 0414 12/03/19 0733  NA 142   < > 131* 140 141 143  --  143  --   K 5.1   < > 4.1 5.2* 5.7* 5.1  --  4.1  --   CL 101  --   --  94* 95*  --   --  94*  --   CO2 32  --   --  35* 36*  --   --  39*  --   GLUCOSE 116*  --   --  170* 172*  --   --  103*  --   BUN 20  --   --  20 22  --   --  41*  --   CREATININE 1.03*  --   --  1.02* 1.18*  --   --  1.09*  --   CALCIUM 8.6*  --   --  9.2 9.1  --   --  8.9  --   DDIMER  --   --   --  0.42  --   --   --   --   --   PROCALCITON  --   --   --   --  <0.10  --  <0.10  --   --   INR  --   --   --  1.6*  --   --   --   --   --   TSH  --   --   --  2.401  --   --   --   --   --  HGBA1C  --   --   --   --  7.0*  --   --   --   --   BNP 69.2  --   --   --  56.7  --   --   --  26.9   < > = values in this interval not displayed.    Recent Labs  Lab 11/30/19 2040 11/30/19 2314 12/01/19 0136 12/01/19 0815 12/02/19 0233 12/03/19 0733  DDIMER  --   --  0.42  --   --   --   BNP 69.2  --   --  56.7  --  26.9  PROCALCITON  --   --   --  <0.10 <0.10  --   SARSCOV2NAA  --  NEGATIVE  --   --   --   --       ------------------------------------------------------------------------------------------------------------------ No results for input(s): CHOL, HDL, LDLCALC, TRIG, CHOLHDL, LDLDIRECT in the last 72 hours.  Lab Results  Component Value Date   HGBA1C 7.0 (H) 12/01/2019   ------------------------------------------------------------------------------------------------------------------ Recent Labs    12/01/19 0136  TSH 2.401   ------------------------------------------------------------------------------------------------------------------ No results for input(s): VITAMINB12, FOLATE, FERRITIN, TIBC, IRON, RETICCTPCT in the last 72 hours.  Coagulation profile Recent Labs  Lab 12/01/19 0136  INR 1.6*    Recent Labs    12/01/19 0136  DDIMER 0.42    Cardiac Enzymes No results for input(s): CKMB, TROPONINI, MYOGLOBIN in the last 168 hours.  Invalid input(s): CK ------------------------------------------------------------------------------------------------------------------    Component Value Date/Time   BNP 26.9 12/03/2019 4098    Micro Results Recent Results (from the past 240 hour(s))  SARS Coronavirus 2 by RT PCR (hospital order, performed in [facility] hospital lab) Nasopharyngeal Nasopharyngeal Swab     Status: None   Collection Time: 11/30/19 11:14 PM   Specimen: Nasopharyngeal Swab  Result Value Ref Range Status   SARS Coronavirus 2 NEGATIVE NEGATIVE Final    Comment: (NOTE) SARS-CoV-2 target nucleic acids are NOT DETECTED.  The SARS-CoV-2 RNA is generally detectable in upper and lower respiratory specimens during the acute phase of infection. The lowest concentration of SARS-CoV-2 viral copies this assay can detect is 250 copies / mL. A negative result does not preclude SARS-CoV-2 infection and should not be used as the sole basis for treatment or other patient management decisions.  A negative result may occur with improper specimen collection / handling,  submission of specimen other than nasopharyngeal swab, presence of viral mutation(s) within the areas targeted by this assay, and inadequate number of viral copies (<250 copies / mL). A negative result must be combined with clinical observations, patient history, and epidemiological information.  Fact Sheet for Patients:   StrictlyIdeas.no  Fact Sheet for Healthcare Providers: BankingDealers.co.za  This test is not yet approved or  cleared by the [place] FDA and has been authorized for detection and/or diagnosis of SARS-CoV-2 by FDA under an Emergency Use Authorization (EUA).  This EUA will remain in effect (meaning this test can be used) for the duration of the COVID-19 declaration under Section 564(b)(1) of the Act, 21 U.S.C. section 360bbb-3(b)(1), unless the authorization is terminated or revoked sooner.  Performed at [facility], [place] [address], [place], [place] [address]   MRSA PCR Screening     Status: None   Collection Time: 12/01/19  8:01 AM   Specimen: Nasal Mucosa; Nasopharyngeal  Result Value Ref Range Status   MRSA by PCR NEGATIVE NEGATIVE Final    Comment:  The GeneXpert MRSA Assay (FDA approved for NASAL specimens only), is one component of a comprehensive MRSA colonization surveillance program. It is not intended to diagnose MRSA infection nor to guide or monitor treatment for MRSA infections. Performed at [facility], [place] [address] [place], [place] [address]   Respiratory Panel by PCR     Status: None   Collection Time: 12/01/19  9:13 AM  Result Value Ref Range Status   Adenovirus NOT DETECTED NOT DETECTED Final   Coronavirus 229E NOT DETECTED NOT DETECTED Final    Comment: (NOTE) The Coronavirus on the Respiratory Panel, DOES NOT test for the novel  Coronavirus (2019 nCoV)    Coronavirus HKU1 NOT DETECTED NOT DETECTED Final   Coronavirus NL63 NOT DETECTED NOT DETECTED Final    Coronavirus OC43 NOT DETECTED NOT DETECTED Final   Metapneumovirus NOT DETECTED NOT DETECTED Final   Rhinovirus / Enterovirus NOT DETECTED NOT DETECTED Final   Influenza A NOT DETECTED NOT DETECTED Final   Influenza B NOT DETECTED NOT DETECTED Final   Parainfluenza Virus 1 NOT DETECTED NOT DETECTED Final   Parainfluenza Virus 2 NOT DETECTED NOT DETECTED Final   Parainfluenza Virus 3 NOT DETECTED NOT DETECTED Final   Parainfluenza Virus 4 NOT DETECTED NOT DETECTED Final   Respiratory Syncytial Virus NOT DETECTED NOT DETECTED Final   Bordetella pertussis NOT DETECTED NOT DETECTED Final   Chlamydophila pneumoniae NOT DETECTED NOT DETECTED Final   Mycoplasma pneumoniae NOT DETECTED NOT DETECTED Final    Comment: Performed at [facility], [place]. [address], [place], [place] [address]    Radiology Reports DG Chest 2 View  Result Date: 12/01/2019 CLINICAL DATA:  Acute onset shortness of breath, COPD, hypoxia EXAM: CHEST - 2 VIEW COMPARISON:  11/30/2019 FINDINGS: Frontal and lateral views of the chest demonstrate a stable cardiac silhouette. Stable prominence of the central pulmonary vasculature without airspace disease, effusion, or pneumothorax. Lungs are mildly hyperinflated consistent with emphysema. No acute bony abnormalities. IMPRESSION: 1. Stable emphysema. 2. Stable vascular congestion without overt edema. Electronically Signed   By: Randa Ngo M.D.   On: 12/01/2019 00:30   DG Chest 2 View  Result Date: 11/30/2019 CLINICAL DATA:  On oxygen did in at home in [place]. She was not allowed to bring her oxygen tank to the [place]. EXAM: CHEST - 2 VIEW COMPARISON:  None. FINDINGS: Enlarged cardiac silhouette. Mildly prominent pulmonary vasculature and interstitial markings without Kerley lines. Small amount of linear atelectasis or scarring in the left lower lung zone and right lung base. The lungs are mildly hyperexpanded. No pleural fluid. Diffuse osteopenia. IMPRESSION: 1.  Cardiomegaly and mild pulmonary vascular congestion. 2. Mild changes of COPD. Electronically Signed   By: Claudie Revering M.D.   On: 11/30/2019 19:14   ECHOCARDIOGRAM COMPLETE  Result Date: 12/02/2019    ECHOCARDIOGRAM REPORT   Patient Name:   Kelly Booker Date of Exam: 12/02/2019 Medical Rec #:  [id number]   Height: Accession #:    [id number]  Weight:       177.2 lb Date of Birth:  1950-08-15    BSA:          1.863 m Patient Age:    50 years    BP:           92/53 mmHg Patient Gender: F           HR:           67 bpm. Exam Location:  Inpatient Procedure:  2D Echo, Cardiac Doppler, Color Doppler and Intracardiac            Opacification Agent Indications:    I50.9* Heart failure (unspecified)  History:        Patient has no prior history of Echocardiogram examinations.                 COPD; Signs/Symptoms:Dyspnea, Altered Mental Status and                 Shortness of Breath. Hypoxia.  Sonographer:    Roseanna Rainbow RDCS Referring Phys: [id number] [facility] A THOMAS  Sonographer Comments: Technically difficult study due to poor echo windows. IMPRESSIONS  1. Left ventricular ejection fraction, by estimation, is 55 to 60%. The left ventricle has normal function. The left ventricle has no regional wall motion abnormalities. There is mild left ventricular hypertrophy. Left ventricular diastolic parameters are indeterminate.  2. Right ventricular systolic function is normal. The right ventricular size is normal.  3. The mitral valve is grossly normal. Trivial mitral valve regurgitation.  4. The aortic valve is tricuspid. Aortic valve regurgitation is not visualized. Mild aortic valve sclerosis is present, with no evidence of aortic valve stenosis. FINDINGS  Left Ventricle: Left ventricular ejection fraction, by estimation, is 55 to 60%. The left ventricle has normal function. The left ventricle has no regional wall motion abnormalities. Definity contrast agent was given IV to delineate the left ventricular  endocardial borders. The  left ventricular internal cavity size was normal in size. There is mild left ventricular hypertrophy. Left ventricular diastolic parameters are indeterminate. Right Ventricle: The right ventricular size is normal. Right vetricular wall thickness was not assessed. Right ventricular systolic function is normal. Left Atrium: Left atrial size was normal in size. Right Atrium: Right atrial size was normal in size. Pericardium: Trivial pericardial effusion is present. Mitral Valve: The mitral valve is grossly normal. Trivial mitral valve regurgitation. Tricuspid Valve: The tricuspid valve is grossly normal. Tricuspid valve regurgitation is trivial. Aortic Valve: The aortic valve is tricuspid. Aortic valve regurgitation is not visualized. Mild aortic valve sclerosis is present, with no evidence of aortic valve stenosis. Pulmonic Valve: The pulmonic valve was not well visualized. Pulmonic valve regurgitation is trivial. Aorta: The aortic root is normal in size and structure. IAS/Shunts: No atrial level shunt detected by color flow Doppler.  LEFT VENTRICLE PLAX 2D LVIDd:         4.40 cm      Diastology LVIDs:         3.20 cm      LV e' lateral:   6.04 cm/s LV PW:         1.20 cm      LV E/e' lateral: 17.7 LV IVS:        1.10 cm      LV e' medial:    4.95 cm/s LVOT diam:     1.90 cm      LV E/e' medial:  21.6 LV SV:         69 LV SV Index:   37 LVOT Area:     2.84 cm  LV Volumes (MOD) LV vol d, MOD A2C: 91.2 ml LV vol d, MOD A4C: 117.0 ml LV vol s, MOD A2C: 39.8 ml LV vol s, MOD A4C: 47.3 ml LV SV MOD A2C:     51.4 ml LV SV MOD A4C:     117.0 ml LV SV MOD BP:      63.6 ml RIGHT VENTRICLE  IVC RV S prime:     6.74 cm/s  IVC diam: 2.30 cm TAPSE (M-mode): 1.6 cm LEFT ATRIUM             Index       RIGHT ATRIUM           Index LA diam:        3.60 cm 1.93 cm/m  RA Area:     15.90 cm LA Vol (A2C):   36.8 ml 19.75 ml/m RA Volume:   40.70 ml  21.85 ml/m LA Vol (A4C):   42.4 ml 22.76 ml/m LA Biplane Vol: 42.2 ml 22.65  ml/m  AORTIC VALVE LVOT Vmax:   112.00 cm/s LVOT Vmean:  66.500 cm/s LVOT VTI:    0.242 m  AORTA Ao Root diam: 3.50 cm Ao Asc diam:  3.70 cm MITRAL VALVE MV Area (PHT): 3.99 cm     SHUNTS MV Decel Time: 190 msec     Systemic VTI:  0.24 m MV E velocity: 107.00 cm/s  Systemic Diam: 1.90 cm MV A velocity: 114.00 cm/s MV E/A ratio:  0.94 Dorris Carnes MD Electronically signed by Dorris Carnes MD Signature Date/Time: 12/02/2019/1:58:48 PM    Final     Time Spent in minutes  30   Lala Lund M.D on 12/03/2019 at 11:01 AM  To page go to [email or web address] - password [email or web address]

## 2019-12-03 NOTE — TOC Initial Note (Signed)
Transition of Care [facility]) - Initial/Assessment Note    Patient Details  Name: Kelly Booker MRN: [id number] Date of Birth: 04-20-1951  Transition of Care [facility]) CM/SW Contact:    Carles Collet, RN Phone Number: 12/03/2019, 11:44 AM  Clinical Narrative:          Patient admitted for resp distress.  From [place], moved here 4 days ago. Requetsed CMA to schedule at [facility] to establish PCP. Added TOC to profile so meds can be filled here at [place]. Will enter [place] at [place]. Requested Zach w [employer] to review for BiPAP/ NIV eligibility.           Expected Discharge Plan: Home/Self Care Barriers to Discharge: Continued Medical Work up   Patient Goals and CMS Choice        Expected Discharge Plan and Services Expected Discharge Plan: Home/Self Care                                              Prior Living Arrangements/Services                       Activities of Daily Living Home Assistive Devices/Equipment: Oxygen ADL Screening (condition at time of admission) Patient's cognitive ability adequate to safely complete daily activities?: Yes Is the patient deaf or have difficulty hearing?: No Does the patient have difficulty seeing, even when wearing glasses/contacts?: No Does the patient have difficulty concentrating, remembering, or making decisions?: Yes Patient able to express need for assistance with ADLs?: Yes Does the patient have difficulty dressing or bathing?: No Independently performs ADLs?: Yes (appropriate for developmental age) Does the patient have difficulty walking or climbing stairs?: Yes Weakness of Legs: None Weakness of Arms/Hands: None  Permission Sought/Granted                  Emotional Assessment              Admission diagnosis:  SOB (shortness of breath) [R06.02] COPD exacerbation ([place]) [J44.1] Pulmonary vascular congestion [R09.89] Patient Active Problem List   Diagnosis Date Noted  . COPD exacerbation ([place]) 11/30/2019    PCP:  Patient, No Pcp Per Pharmacy:   [id number] [address], [place] [address] Phone: [phone] Fax: [phone]     Social Determinants of Health (SDOH) Interventions    Readmission Risk Interventions No flowsheet data found.

## 2019-12-03 NOTE — Progress Notes (Signed)
Occupational Therapy Treatment Patient Details Name: Kelly Booker MRN: [id number] DOB: August 05, 1950 Today's Date: 12/03/2019    History of present illness 69 yo Spanish speaking female arrived from [place] 4 days prior to admission with hx of COPD and hypoxia presented to ER because she didn't have oxygen set up and had increased dyspnea.  Admitted by hospitalist with COPD exacerbation and acute on chronic hypoxic/hypercapnic respiratory failure.  Developed altered mental status 6/27 and transferred to ICU for Bipap therapy.   OT comments  Patient up in chair on arrival.  Patient requesting to use the bathroom.  She was able to walk to bathroom and complete toileting with supervision, as well as stand at sink and brush teeth.  Patient's SpO2 decreased from 90 to 85 with mobility though patient denying any shortness of breath and had no visable symptoms.  Discussed energy conservation strategies to use at home for increased safety.  Patient very thankful during session for her entire care team's kindness and assistance, including interpreters.  Will continue to follow with OT acutely to address the deficits listed below.    Follow Up Recommendations  No OT follow up;Supervision - Intermittent    Equipment Recommendations  3 in 1 bedside commode    Recommendations for Other Services      Precautions / Restrictions Precautions Precautions: Fall Precaution Comments: watch O2 Restrictions Weight Bearing Restrictions: No       Mobility Bed Mobility               General bed mobility comments: pt sitting in recliner upon arrival  Transfers Overall transfer level: Needs assistance Equipment used: None Transfers: Sit to/from Stand Sit to Stand: Supervision              Balance Overall balance assessment: Mild deficits observed, not formally tested                                         ADL either performed or assessed with clinical judgement   ADL  Overall ADL's : Needs assistance/impaired     Grooming: Supervision/safety;Standing;Wash/dry hands;Oral care                   Toilet Transfer: Supervision/safety;Ambulation   Toileting- Clothing Manipulation and Hygiene: Supervision/safety;Sit to/from stand       Functional mobility during ADLs: Supervision/safety       Vision       Perception     Praxis      Cognition Arousal/Alertness: Awake/alert Behavior During Therapy: WFL for tasks assessed/performed Overall Cognitive Status: Within Functional Limits for tasks assessed                                          Exercises     Shoulder Instructions       General Comments Interpreter used throughout session. Initially with Elita Quick #[id number] which cut out mid session, and finished with Edelin #[id number]. On 4L O2 [place] during session, SpO2 90 at rest and 85 with mob     Pertinent Vitals/ Pain       Pain Assessment: No/denies pain  Home Living  Prior Functioning/Environment              Frequency  Min 2X/week        Progress Toward Goals  OT Goals(current goals can now be found in the care plan section)  Progress towards OT goals: Progressing toward goals  Acute Rehab OT Goals Patient Stated Goal: to get stronger  OT Goal Formulation: With patient/family Time For Goal Achievement: 12/16/19 Potential to Achieve Goals: Good  Plan Discharge plan remains appropriate    Co-evaluation                 AM-PAC OT "6 Clicks" Daily Activity     Outcome Measure   Help from another person eating meals?: None Help from another person taking care of personal grooming?: A Little Help from another person toileting, which includes using toliet, bedpan, or urinal?: A Little Help from another person bathing (including washing, rinsing, drying)?: A Little Help from another person to put on and taking off regular upper body  clothing?: None Help from another person to put on and taking off regular lower body clothing?: A Little 6 Click Score: 20    End of Session Equipment Utilized During Treatment: Oxygen  OT Visit Diagnosis: Other abnormalities of gait and mobility (R26.89)   Activity Tolerance Patient tolerated treatment well   Patient Left in chair;with call bell/phone within reach   Nurse Communication Mobility status        Time: 1103-1594 OT Time Calculation (min): 23 min  Charges: OT General Charges $OT Visit: 1 Visit OT Treatments $Self Care/Home Management : 23-37 mins  August Luz, OTR/L    Phylliss Bob 12/03/2019, 12:48 PM

## 2019-12-04 LAB — CBC WITH DIFFERENTIAL/PLATELET
Abs Immature Granulocytes: 0.01 10*3/uL (ref 0.00–0.07)
Basophils Absolute: 0 10*3/uL (ref 0.0–0.1)
Basophils Relative: 0 %
Eosinophils Absolute: 0 10*3/uL (ref 0.0–0.5)
Eosinophils Relative: 0 %
HCT: 50 % — ABNORMAL HIGH (ref 36.0–46.0)
Hemoglobin: 15.2 g/dL — ABNORMAL HIGH (ref 12.0–15.0)
Immature Granulocytes: 0 %
Lymphocytes Relative: 38 %
Lymphs Abs: 3.1 10*3/uL (ref 0.7–4.0)
MCH: 30.2 pg (ref 26.0–34.0)
MCHC: 30.4 g/dL (ref 30.0–36.0)
MCV: 99.4 fL (ref 80.0–100.0)
Monocytes Absolute: 0.8 10*3/uL (ref 0.1–1.0)
Monocytes Relative: 10 %
Neutro Abs: 4.2 10*3/uL (ref 1.7–7.7)
Neutrophils Relative %: 52 %
Platelets: 193 10*3/uL (ref 150–400)
RBC: 5.03 MIL/uL (ref 3.87–5.11)
RDW: 17 % — ABNORMAL HIGH (ref 11.5–15.5)
WBC: 8.2 10*3/uL (ref 4.0–10.5)
nRBC: 0 % (ref 0.0–0.2)

## 2019-12-04 LAB — COMPREHENSIVE METABOLIC PANEL
ALT: 38 U/L (ref 0–44)
AST: 36 U/L (ref 15–41)
Albumin: 3.2 g/dL — ABNORMAL LOW (ref 3.5–5.0)
Alkaline Phosphatase: 60 U/L (ref 38–126)
Anion gap: 8 (ref 5–15)
BUN: 37 mg/dL — ABNORMAL HIGH (ref 8–23)
CO2: 37 mmol/L — ABNORMAL HIGH (ref 22–32)
Calcium: 9.2 mg/dL (ref 8.9–10.3)
Chloride: 94 mmol/L — ABNORMAL LOW (ref 98–111)
Creatinine, Ser: 1.1 mg/dL — ABNORMAL HIGH (ref 0.44–1.00)
GFR calc Af Amer: 60 mL/min — ABNORMAL LOW (ref >60)
GFR calc non Af Amer: 52 mL/min — ABNORMAL LOW (ref >60)
Glucose, Bld: 102 mg/dL — ABNORMAL HIGH (ref 70–99)
Potassium: 4.3 mmol/L (ref 3.5–5.1)
Sodium: 139 mmol/L (ref 135–145)
Total Bilirubin: 0.5 mg/dL (ref 0.3–1.2)
Total Protein: 6.8 g/dL (ref 6.5–8.1)

## 2019-12-04 LAB — GLUCOSE, CAPILLARY
Glucose-Capillary: 85 mg/dL (ref 70–99)
Glucose-Capillary: 132 mg/dL — ABNORMAL HIGH (ref 70–99)
Glucose-Capillary: 131 mg/dL — ABNORMAL HIGH (ref 70–99)
Glucose-Capillary: 127 mg/dL — ABNORMAL HIGH (ref 70–99)

## 2019-12-04 LAB — MAGNESIUM: Magnesium: 2.1 mg/dL (ref 1.7–2.4)

## 2019-12-04 LAB — BRAIN NATRIURETIC PEPTIDE: B Natriuretic Peptide: 20.3 pg/mL (ref 0.0–100.0)

## 2019-12-04 MED ORDER — RIVAROXABAN 20 MG PO TABS
20.0000 mg | ORAL_TABLET | Freq: Every day | ORAL | Status: DC
  Administered 2019-12-05 – 2019-12-09 (×5): 20 mg via ORAL
  Filled 2019-12-04 (×5): qty 1

## 2019-12-04 NOTE — Progress Notes (Signed)
SATURATION QUALIFICATIONS: (This note is used to comply with regulatory documentation for home oxygen)  Patient Saturations on Room Air at Rest = 87%%  Patient Saturations on Room Air while Ambulating = 86% (very brief distance and initiated O2) as pt below 88% at rest on room air  Patient Saturations on 3 Liters of oxygen while Ambulating = 94%  on 2L of oxygen while walking 92%  Please briefly explain why patient needs home oxygen: to maintain oxygen saturation 88% and above to allow her to complete mobility safely   Arby Barrette, PT Pager [phone]

## 2019-12-04 NOTE — Plan of Care (Signed)
Patient A&O x4. VSS. Pt on 3 L of oxygen sating above 93%. Lungs sounds regular and  Diminished. Free from falls. Pt turns self in bed. Ambulated to chair with one assist. Patient ambulated in hallway during shift and sat up in chair. Pt tolerating fluids and meals.  Pt voiding adequately during shift. DVT prophylactic: xarelto. No c/o of pain. Bed wheels locked. Phone and call bell within reach. Pt is resting, no distress. POC reviewed with son.  Problem: Education: Goal: Knowledge of General Education information will improve Description: Including pain rating scale, medication(s)/side effects and non-pharmacologic comfort measures Outcome: Progressing   Problem: Health Behavior/Discharge Planning: Goal: Ability to manage health-related needs will improve Outcome: Progressing   Problem: Clinical Measurements: Goal: Ability to maintain clinical measurements within normal limits will improve Outcome: Progressing Goal: Will remain free from infection Outcome: Progressing Goal: Diagnostic test results will improve Outcome: Progressing Goal: Respiratory complications will improve Outcome: Progressing Goal: Cardiovascular complication will be avoided Outcome: Progressing   Problem: Activity: Goal: Risk for activity intolerance will decrease Outcome: Progressing   Problem: Nutrition: Goal: Adequate nutrition will be maintained Outcome: Progressing   Problem: Coping: Goal: Level of anxiety will decrease Outcome: Progressing   Problem: Elimination: Goal: Will not experience complications related to bowel motility Outcome: Progressing Goal: Will not experience complications related to urinary retention Outcome: Progressing   Problem: Pain Managment: Goal: General experience of comfort will improve Outcome: Progressing   Problem: Safety: Goal: Ability to remain free from injury will improve Outcome: Progressing   Problem: Skin Integrity: Goal: Risk for impaired skin integrity  will decrease Outcome: Progressing

## 2019-12-04 NOTE — Progress Notes (Signed)
   12/04/19 0842  Assess: MEWS Score  Temp 98.2 F (36.8 C)  BP (!) 106/54  Pulse Rate (!) 52  ECG Heart Rate (!) 52  Resp 15  Level of Consciousness Alert  SpO2 96 %  O2 Device Bi-PAP  Assess: MEWS Score  MEWS Temp 0  MEWS Systolic 0  MEWS Pulse 0  MEWS RR 0  MEWS LOC 0  MEWS Score 0  MEWS Score Color Green  Assess: if the MEWS score is Yellow or Red  Were vital signs taken at a resting state? Yes  Focused Assessment Documented focused assessment  Early Detection of Sepsis Score *See Row Information* Low  MEWS guidelines implemented *See Row Information* Yes  Treat  MEWS Interventions Administered prn meds/treatments  Take Vital Signs  Increase Vital Sign Frequency  Yellow: Q 2hr X 2 then Q 4hr X 2, if remains yellow, continue Q 4hrs  Escalate  MEWS: Escalate Yellow: discuss with charge nurse/RN and consider discussing with provider and RRT  Notify: Charge Nurse/RN  Name of Charge Nurse/RN Notified Erin,RN  Date Charge Nurse/RN Notified 12/04/19  Time Charge Nurse/RN Notified [phone]  Document  Patient Outcome Stabilized after interventions  Progress note created (see row info) Yes

## 2019-12-04 NOTE — Progress Notes (Signed)
Physical Therapy Treatment Patient Details Name: Kelly Booker MRN: [id number] DOB: 07/12/1950 Today's Date: 12/04/2019    History of Present Illness 69 yo Spanish speaking female arrived from [place] 4 days prior to admission with hx of COPD and hypoxia presented to ER because she didn't have oxygen set up and had increased dyspnea.  Admitted by hospitalist with COPD exacerbation and acute on chronic hypoxic/hypercapnic respiratory failure.  Developed altered mental status 6/27 and transferred to ICU for Bipap therapy.    PT Comments    Interpreter used for entire session. Patient tends to move too quickly to allow her to maintain her oxygen saturation. Patient appreciated education re: normal oxygen saturation levels and what can happen when sats are too low. Her main symptom she notices when her sats are low is overall fatigue and sleepiness. See below for further education re: need for rest periods and correct pursed lip breathing for recovery when sats drop. See separate oxygen qualification note for levels with activity.    Follow Up Recommendations  No PT follow up     Equipment Recommendations  None recommended by PT    Recommendations for Other Services       Precautions / Restrictions Precautions Precautions: Fall Precaution Comments: watch O2    Mobility  Bed Mobility Overal bed mobility: Modified Independent                Transfers Overall transfer level: Independent Equipment used: None Transfers: Sit to/from Stand Sit to Stand: Supervision;Independent            Ambulation/Gait Ambulation/Gait assistance: Min guard Gait Distance (Feet): 300 Feet Assistive device: None (pt rolling the portable O2 tank behind her) Gait Pattern/deviations: WFL(Within Functional Limits) Gait velocity: too fast for her oxygen needs   General Gait Details: generally steady, even having to manage the portable O2 tank.  Sats on RA fell to 86%; required 2L for sats  92%   Stairs             Wheelchair Mobility    Modified Rankin (Stroke Patients Only)       Balance Overall balance assessment: No apparent balance deficits (not formally assessed)                                          Cognition Arousal/Alertness: Awake/alert Behavior During Therapy: WFL for tasks assessed/performed Overall Cognitive Status: Within Functional Limits for tasks assessed                                        Exercises Other Exercises Other Exercises: PLB with repeated cues to blow on exhalation (not just exhale as normal).    General Comments General comments (skin integrity, edema, etc.): Virtual interpreter via iPad Kelly Booker [phone]) used throughout session (including while walking as she needed vc's to slow velocity and to take standing rest breaks with PLB to raise O2 sats). Lots of education related to her saturation levels, normal levels, when it's too low, symptoms she experiences that are a warning (mostly feels overall fatigue and sleepiness). She stated she usually feels good in the mornings and by the afternoon all she can do is sleep. Educated on taking rest breaks in the morning to conserve energy for the afternoons. Educated on need to move more slowly to  conserve oxygen.       Pertinent Vitals/Pain Pain Assessment: No/denies pain    Home Living                      Prior Function            PT Goals (current goals can now be found in the care plan section) Acute Rehab PT Goals Patient Stated Goal: to get stronger  Time For Goal Achievement: 12/09/19 Potential to Achieve Goals: Good Progress towards PT goals: Progressing toward goals    Frequency    Min 3X/week      PT Plan Current plan remains appropriate    Co-evaluation              AM-PAC PT "6 Clicks" Mobility   Outcome Measure  Help needed turning from your back to your side while in a flat bed without using  bedrails?: None Help needed moving from lying on your back to sitting on the side of a flat bed without using bedrails?: None Help needed moving to and from a bed to a chair (including a wheelchair)?: None Help needed standing up from a chair using your arms (e.g., wheelchair or bedside chair)?: None Help needed to walk in hospital room?: A Little Help needed climbing 3-5 steps with a railing? : A Little 6 Click Score: 22    End of Session Equipment Utilized During Treatment: Oxygen Activity Tolerance: Patient tolerated treatment well Patient left: in chair;with call bell/phone within reach;with chair alarm set Nurse Communication: Mobility status PT Visit Diagnosis: Other abnormalities of gait and mobility (R26.89);Difficulty in walking, not elsewhere classified (R26.2)     Time: 3559-7416 PT Time Calculation (min) (ACUTE ONLY): 46 min  Charges:  $Gait Training: 23-37 mins $Self Care/Home Management: 8-22                      Kelly Booker, PT Pager [phone]   Kelly Booker 12/04/2019, 12:31 PM

## 2019-12-04 NOTE — Progress Notes (Signed)
PROGRESS NOTE                                                                                                                                                                                                             Patient Demographics:    Kelly Booker, is a 69 y.o. female, DOB - 07/04/1950, XHB:[id number]  Admit date - 11/30/2019   Admitting Physician Clance Boll, MD  Outpatient Primary MD for the patient is Patient, No Pcp Per  LOS - 4  Chief Complaint  Patient presents with  . no oxygen tank       Brief Narrative  69 yo Spanish speaking female arrived from [place] 4 days prior to admission with hx of COPD and hypoxia presented to ER because she didn't have oxygen set up and had increased dyspnea.  Admitted by hospitalist with COPD exacerbation and acute on chronic hypoxic/hypercapnic respiratory failure.  Developed altered mental status 6/27 and transferred to ICU for Bipap therapy, he was stabilized and was transferred on 3 L nasal cannula oxygen with nighttime BiPAP to the floor under my service on 12/03/2019.   Subjective:    [facility] today has, No headache, No chest pain, No abdominal pain - No Nausea, No new weakness tingling or numbness, no cough and much improved shortness of breath   Assessment  & Plan :     1.  Acute on chronic hypoxic and hypercapnic respiratory failure due to COPD exacerbation.  Also has likely underlying OSA and OHS with metabolic encephalopathy.  She was admitted to ICU and treated by pulmonary critical care with continuous BiPAP, IV steroids and supplemental oxygen, her mentation has improved, she is now stable on 3 to 4 L nasal cannula oxygen in daytime, still using nighttime BiPAP.  Continue steroid taper and supportive care with nebulizer treatments.  Added I-S and flutter valve.  Echo shows chronic diastolic dysfunction with preserved EF of 60%.  Advance activity.  PT OT evaluation. -Patient is using CPAP at home, so we will  attempt using CPAP this evening instead of BiPAP.  2.  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  Echo stable.  3.  Morbid obesity.  BMI greater than 35.  Follow with PCP for weight loss.  4.  DM type II.  Currently on sliding scale.  Stable, most likely upon discharge will require oral hypoglycemic, diabetic education will be ordered.  Lab Results  Component Value Date   HGBA1C 7.0 (H) 12/01/2019   CBG (  last 3)  Recent Labs    12/03/19 2107 12/04/19 0726 12/04/19 1200  GLUCAP 116* 85 132*      Condition - Fair  Family Communication  :  None  Code Status :  Full  Consults  :  PCCM  Procedures  :    TTE - 1. Left ventricular ejection fraction, by estimation, is 55 to 60%. The left ventricle has normal function. The left ventricle has no regional wall motion abnormalities. There is mild left ventricular hypertrophy. Left ventricular diastolic parameters are indeterminate.  2. Right ventricular systolic function is normal. The right ventricular size is normal.  3. The mitral valve is grossly normal. Trivial mitral valve regurgitation.  4. The aortic valve is tricuspid. Aortic valve regurgitation is not visualized. Mild aortic valve sclerosis is present, with no evidence of aortic valve stenosis   PUD Prophylaxis : PPI  Disposition Plan  :    Status is: Inpatient  Remains inpatient appropriate because:IV treatments appropriate due to intensity of illness or inability to take PO   Dispo: The patient is from: Home              Anticipated d/c is to: Home              Anticipated d/c date is: 2 days              Patient currently is not medically stable to d/c.  DVT Prophylaxis  : Xarelto  Lab Results  Component Value Date   PLT 193 12/04/2019    Diet :  Diet Order            Diet heart healthy/carb modified Room service appropriate? No; Fluid consistency: Thin  Diet effective now                  Inpatient Medications Scheduled Meds: .  chlorhexidine  15 mL Mouth Rinse BID  . Chlorhexidine Gluconate Cloth  6 each Topical Daily  . insulin aspart  0-20 Units Subcutaneous TID WC  . insulin aspart  0-5 Units Subcutaneous QHS  . ipratropium-albuterol  3 mL Nebulization BID  . mouth rinse  15 mL Mouth Rinse q12n4p  . pantoprazole  40 mg Oral Daily  . polyethylene glycol  17 g Oral Daily  . predniSONE  30 mg Oral Q breakfast  . [START ON 12/05/2019] rivaroxaban  20 mg Oral Q supper  . sodium chloride flush  3 mL Intravenous Q12H   Continuous Infusions:  PRN Meds:.albuterol, docusate sodium  Antibiotics  :   Anti-infectives (From admission, onward)   Start     Dose/Rate Route Frequency Ordered Stop   12/01/19 1600  azithromycin (ZITHROMAX) tablet 250 mg       "Followed by" Linked Group Details   250 mg Oral Daily 11/30/19 2355 12/04/19 1032   12/01/19 0830  cefTRIAXone (ROCEPHIN) 2 g in sodium chloride 0.9 % 100 mL IVPB  Status:  Discontinued        2 g 200 mL/hr over 30 Minutes Intravenous Every 24 hours 12/01/19 0735 12/02/19 0900   12/01/19 0000  azithromycin (ZITHROMAX) tablet 500 mg       "Followed by" Linked Group Details   500 mg Oral Daily 11/30/19 2355 12/01/19 0128          Objective:   Vitals:   12/04/19 0947 12/04/19 1037 12/04/19 1203 12/04/19 1207  BP:  91/61 (!) 92/54   Pulse: 77 67 63 66  Resp: 16 14 13  18  Temp:  (!) 97.4 F (36.3 C) 98.2 F (36.8 C)   TempSrc:  Axillary Oral   SpO2: 92% 91% 93% 91%  Weight:        SpO2: 91 % O2 Flow Rate (L/min): 3 L/min FiO2 (%): 50 %  Wt Readings from Last 3 Encounters:  12/04/19 81.8 kg     Intake/Output Summary (Last 24 hours) at 12/04/2019 1358 Last data filed at 12/04/2019 0959 Gross per 24 hour  Intake 360 ml  Output --  Net 360 ml     Physical Exam  Awake Alert, Oriented X 3, No new F.N deficits, Normal affect Symmetrical Chest wall movement, Good air movement bilaterally, CTAB RRR,No Gallops,Rubs or new Murmurs, No Parasternal  Heave +ve B.Sounds, Abd Soft, No tenderness, No rebound - guarding or rigidity. No Cyanosis, Clubbing or edema, No new Rash or bruise        Data Review:    Recent Labs  Lab 11/30/19 2040 11/30/19 2040 12/01/19 0014 12/01/19 0136 12/01/19 1143 12/03/19 0414 12/04/19 0507  WBC 7.6  --   --  9.6  --  9.0 8.2  HGB 15.3*   < > 16.7* 16.3* 18.7* 15.6* 15.2*  HCT 52.3*   < > 49.0* 55.2* 55.0* 51.1* 50.0*  PLT 200  --   --  216  --  222 193  MCV 103.8*  --   --  102.6*  --  99.0 99.4  MCH 30.4  --   --  30.3  --  30.2 30.2  MCHC 29.3*  --   --  29.5*  --  30.5 30.4  RDW 18.0*  --   --  17.9*  --  17.2* 17.0*  LYMPHSABS 2.2  --   --   --   --   --  3.1  MONOABS 0.7  --   --   --   --   --  0.8  EOSABS 0.1  --   --   --   --   --  0.0  BASOSABS 0.0  --   --   --   --   --  0.0   < > = values in this interval not displayed.    Recent Labs  Lab 11/30/19 2040 12/01/19 0014 12/01/19 0136 12/01/19 0815 12/01/19 1143 12/02/19 0233 12/03/19 0414 12/03/19 0733 12/04/19 0507  NA 142   < > 140 141 143  --  143  --  139  K 5.1   < > 5.2* 5.7* 5.1  --  4.1  --  4.3  CL 101  --  94* 95*  --   --  94*  --  94*  CO2 32  --  35* 36*  --   --  39*  --  37*  GLUCOSE 116*  --  170* 172*  --   --  103*  --  102*  BUN 20  --  20 22  --   --  41*  --  37*  CREATININE 1.03*  --  1.02* 1.18*  --   --  1.09*  --  1.10*  CALCIUM 8.6*  --  9.2 9.1  --   --  8.9  --  9.2  AST  --   --   --   --   --   --   --   --  36  ALT  --   --   --   --   --   --   --   --  38  ALKPHOS  --   --   --   --   --   --   --   --  60  BILITOT  --   --   --   --   --   --   --   --  0.5  ALBUMIN  --   --   --   --   --   --   --   --  3.2*  MG  --   --   --   --   --   --   --   --  2.1  DDIMER  --   --  0.42  --   --   --   --   --   --   PROCALCITON  --   --   --  <0.10  --  <0.10  --   --   --   INR  --   --  1.6*  --   --   --   --   --   --   TSH  --   --  2.401  --   --   --   --   --   --   HGBA1C  --    --   --  7.0*  --   --   --   --   --   BNP 69.2  --   --  56.7  --   --   --  26.9 20.3   < > = values in this interval not displayed.    Recent Labs  Lab 11/30/19 2040 11/30/19 2314 12/01/19 0136 12/01/19 0815 12/02/19 0233 12/03/19 0733 12/04/19 0507  DDIMER  --   --  0.42  --   --   --   --   BNP 69.2  --   --  56.7  --  26.9 20.3  PROCALCITON  --   --   --  <0.10 <0.10  --   --   SARSCOV2NAA  --  NEGATIVE  --   --   --   --   --     ------------------------------------------------------------------------------------------------------------------ No results for input(s): CHOL, HDL, LDLCALC, TRIG, CHOLHDL, LDLDIRECT in the last 72 hours.  Lab Results  Component Value Date   HGBA1C 7.0 (H) 12/01/2019   ------------------------------------------------------------------------------------------------------------------ No results for input(s): TSH, T4TOTAL, T3FREE, THYROIDAB in the last 72 hours.  Invalid input(s): FREET3 ------------------------------------------------------------------------------------------------------------------ No results for input(s): VITAMINB12, FOLATE, FERRITIN, TIBC, IRON, RETICCTPCT in the last 72 hours.  Coagulation profile Recent Labs  Lab 12/01/19 0136  INR 1.6*    No results for input(s): DDIMER in the last 72 hours.  Cardiac Enzymes No results for input(s): CKMB, TROPONINI, MYOGLOBIN in the last 168 hours.  Invalid input(s): CK ------------------------------------------------------------------------------------------------------------------    Component Value Date/Time   BNP 20.3 12/04/2019 0507    Micro Results Recent Results (from the past 240 hour(s))  SARS Coronavirus 2 by RT PCR (hospital order, performed in [facility] hospital lab) Nasopharyngeal Nasopharyngeal Swab     Status: None   Collection Time: 11/30/19 11:14 PM   Specimen: Nasopharyngeal Swab  Result Value Ref Range Status   SARS Coronavirus 2 NEGATIVE NEGATIVE  Final    Comment: (NOTE) SARS-CoV-2 target nucleic acids are NOT DETECTED.  The SARS-CoV-2 RNA is generally detectable in upper and lower respiratory specimens during the acute phase of infection. The lowest concentration of SARS-CoV-2 viral copies this assay  can detect is 250 copies / mL. A negative result does not preclude SARS-CoV-2 infection and should not be used as the sole basis for treatment or other patient management decisions.  A negative result may occur with improper specimen collection / handling, submission of specimen other than nasopharyngeal swab, presence of viral mutation(s) within the areas targeted by this assay, and inadequate number of viral copies (<250 copies / mL). A negative result must be combined with clinical observations, patient history, and epidemiological information.  Fact Sheet for Patients:   StrictlyIdeas.no  Fact Sheet for Healthcare Providers: BankingDealers.co.za  This test is not yet approved or  cleared by the [place] FDA and has been authorized for detection and/or diagnosis of SARS-CoV-2 by FDA under an Emergency Use Authorization (EUA).  This EUA will remain in effect (meaning this test can be used) for the duration of the COVID-19 declaration under Section 564(b)(1) of the Act, 21 U.S.C. section 360bbb-3(b)(1), unless the authorization is terminated or revoked sooner.  Performed at [facility], [place] [address], [place], [place] [address]   MRSA PCR Screening     Status: None   Collection Time: 12/01/19  8:01 AM   Specimen: Nasal Mucosa; Nasopharyngeal  Result Value Ref Range Status   MRSA by PCR NEGATIVE NEGATIVE Final    Comment:        The GeneXpert MRSA Assay (FDA approved for NASAL specimens only), is one component of a comprehensive MRSA colonization surveillance program. It is not intended to diagnose MRSA infection nor to guide or monitor treatment for MRSA  infections. Performed at [facility], [place] [address], [place], [place] [address]   Respiratory Panel by PCR     Status: None   Collection Time: 12/01/19  9:13 AM  Result Value Ref Range Status   Adenovirus NOT DETECTED NOT DETECTED Final   Coronavirus 229E NOT DETECTED NOT DETECTED Final    Comment: (NOTE) The Coronavirus on the Respiratory Panel, DOES NOT test for the novel  Coronavirus (2019 nCoV)    Coronavirus HKU1 NOT DETECTED NOT DETECTED Final   Coronavirus NL63 NOT DETECTED NOT DETECTED Final   Coronavirus OC43 NOT DETECTED NOT DETECTED Final   Metapneumovirus NOT DETECTED NOT DETECTED Final   Rhinovirus / Enterovirus NOT DETECTED NOT DETECTED Final   Influenza A NOT DETECTED NOT DETECTED Final   Influenza B NOT DETECTED NOT DETECTED Final   Parainfluenza Virus 1 NOT DETECTED NOT DETECTED Final   Parainfluenza Virus 2 NOT DETECTED NOT DETECTED Final   Parainfluenza Virus 3 NOT DETECTED NOT DETECTED Final   Parainfluenza Virus 4 NOT DETECTED NOT DETECTED Final   Respiratory Syncytial Virus NOT DETECTED NOT DETECTED Final   Bordetella pertussis NOT DETECTED NOT DETECTED Final   Chlamydophila pneumoniae NOT DETECTED NOT DETECTED Final   Mycoplasma pneumoniae NOT DETECTED NOT DETECTED Final    Comment: Performed at [facility], [place]. [address] [place], [place] [address]    Radiology Reports DG Chest 2 View  Result Date: 12/01/2019 CLINICAL DATA:  Acute onset shortness of breath, COPD, hypoxia EXAM: CHEST - 2 VIEW COMPARISON:  11/30/2019 FINDINGS: Frontal and lateral views of the chest demonstrate a stable cardiac silhouette. Stable prominence of the central pulmonary vasculature without airspace disease, effusion, or pneumothorax. Lungs are mildly hyperinflated consistent with emphysema. No acute bony abnormalities. IMPRESSION: 1. Stable emphysema. 2. Stable vascular congestion without overt edema. Electronically Signed   By: Randa Ngo M.D.   On: 12/01/2019  00:30   DG Chest 2 View  Result Date: 11/30/2019 CLINICAL DATA:  On oxygen did in at home in [place]. She was not allowed to bring her oxygen tank to the [place]. EXAM: CHEST - 2 VIEW COMPARISON:  None. FINDINGS: Enlarged cardiac silhouette. Mildly prominent pulmonary vasculature and interstitial markings without Kerley lines. Small amount of linear atelectasis or scarring in the left lower lung zone and right lung base. The lungs are mildly hyperexpanded. No pleural fluid. Diffuse osteopenia. IMPRESSION: 1. Cardiomegaly and mild pulmonary vascular congestion. 2. Mild changes of COPD. Electronically Signed   By: Claudie Revering M.D.   On: 11/30/2019 19:14   ECHOCARDIOGRAM COMPLETE  Result Date: 12/02/2019    ECHOCARDIOGRAM REPORT   Patient Name:   Kelly Booker Date of Exam: 12/02/2019 Medical Rec #:  [id number]   Height: Accession #:    [id number]  Weight:       177.2 lb Date of Birth:  July 05, 1950    BSA:          1.863 m Patient Age:    46 years    BP:           92/53 mmHg Patient Gender: F           HR:           67 bpm. Exam Location:  Inpatient Procedure: 2D Echo, Cardiac Doppler, Color Doppler and Intracardiac            Opacification Agent Indications:    I50.9* Heart failure (unspecified)  History:        Patient has no prior history of Echocardiogram examinations.                 COPD; Signs/Symptoms:Dyspnea, Altered Mental Status and                 Shortness of Breath. Hypoxia.  Sonographer:    Roseanna Rainbow RDCS Referring Phys: [id number] [facility] A THOMAS  Sonographer Comments: Technically difficult study due to poor echo windows. IMPRESSIONS  1. Left ventricular ejection fraction, by estimation, is 55 to 60%. The left ventricle has normal function. The left ventricle has no regional wall motion abnormalities. There is mild left ventricular hypertrophy. Left ventricular diastolic parameters are indeterminate.  2. Right ventricular systolic function is normal. The right ventricular size is normal.  3.  The mitral valve is grossly normal. Trivial mitral valve regurgitation.  4. The aortic valve is tricuspid. Aortic valve regurgitation is not visualized. Mild aortic valve sclerosis is present, with no evidence of aortic valve stenosis. FINDINGS  Left Ventricle: Left ventricular ejection fraction, by estimation, is 55 to 60%. The left ventricle has normal function. The left ventricle has no regional wall motion abnormalities. Definity contrast agent was given IV to delineate the left ventricular  endocardial borders. The left ventricular internal cavity size was normal in size. There is mild left ventricular hypertrophy. Left ventricular diastolic parameters are indeterminate. Right Ventricle: The right ventricular size is normal. Right vetricular wall thickness was not assessed. Right ventricular systolic function is normal. Left Atrium: Left atrial size was normal in size. Right Atrium: Right atrial size was normal in size. Pericardium: Trivial pericardial effusion is present. Mitral Valve: The mitral valve is grossly normal. Trivial mitral valve regurgitation. Tricuspid Valve: The tricuspid valve is grossly normal. Tricuspid valve regurgitation is trivial. Aortic Valve: The aortic valve is tricuspid. Aortic valve regurgitation is not visualized. Mild aortic valve sclerosis is present, with no evidence of aortic valve  stenosis. Pulmonic Valve: The pulmonic valve was not well visualized. Pulmonic valve regurgitation is trivial. Aorta: The aortic root is normal in size and structure. IAS/Shunts: No atrial level shunt detected by color flow Doppler.  LEFT VENTRICLE PLAX 2D LVIDd:         4.40 cm      Diastology LVIDs:         3.20 cm      LV e' lateral:   6.04 cm/s LV PW:         1.20 cm      LV E/e' lateral: 17.7 LV IVS:        1.10 cm      LV e' medial:    4.95 cm/s LVOT diam:     1.90 cm      LV E/e' medial:  21.6 LV SV:         69 LV SV Index:   37 LVOT Area:     2.84 cm  LV Volumes (MOD) LV vol d, MOD A2C: 91.2  ml LV vol d, MOD A4C: 117.0 ml LV vol s, MOD A2C: 39.8 ml LV vol s, MOD A4C: 47.3 ml LV SV MOD A2C:     51.4 ml LV SV MOD A4C:     117.0 ml LV SV MOD BP:      63.6 ml RIGHT VENTRICLE            IVC RV S prime:     6.74 cm/s  IVC diam: 2.30 cm TAPSE (M-mode): 1.6 cm LEFT ATRIUM             Index       RIGHT ATRIUM           Index LA diam:        3.60 cm 1.93 cm/m  RA Area:     15.90 cm LA Vol (A2C):   36.8 ml 19.75 ml/m RA Volume:   40.70 ml  21.85 ml/m LA Vol (A4C):   42.4 ml 22.76 ml/m LA Biplane Vol: 42.2 ml 22.65 ml/m  AORTIC VALVE LVOT Vmax:   112.00 cm/s LVOT Vmean:  66.500 cm/s LVOT VTI:    0.242 m  AORTA Ao Root diam: 3.50 cm Ao Asc diam:  3.70 cm MITRAL VALVE MV Area (PHT): 3.99 cm     SHUNTS MV Decel Time: 190 msec     Systemic VTI:  0.24 m MV E velocity: 107.00 cm/s  Systemic Diam: 1.90 cm MV A velocity: 114.00 cm/s MV E/A ratio:  0.94 Dorris Carnes MD Electronically signed by Dorris Carnes MD Signature Date/Time: 12/02/2019/1:58:48 PM    Final       Phillips Climes M.D on 12/04/2019 at 1:58 PM  To page go to [email or web address] - password [facility]

## 2019-12-04 NOTE — TOC Progression Note (Signed)
Transition of Care [facility]) - Progression Note    Patient Details  Name: Cohen Doleman MRN: [id number] Date of Birth: 1950-10-30  Transition of Care [facility]) CM/SW Contact  Carles Collet, RN Phone Number: 12/04/2019, 11:09 AM  Clinical Narrative:    Damaris Schooner w patient's son [name]. Oxygen will be delivered to the house today at [place]. They have Fed Ex'd the CPAP machine from the patient's home and expect it to arrive today or tomorrow. MD is going to use CPAP tonight instead of BiPAP to see how the patient tolerates. If the patient does not tolerate CPAP, still may need to consider BIPAP LOG.  Will place LOG form on chart for next CM if needed.     Expected Discharge Plan: Home/Self Care Barriers to Discharge: Continued Medical Work up  Expected Discharge Plan and Services Expected Discharge Plan: Home/Self Care   Discharge Planning Services: CM Consult                     DME Arranged: NIV DME Agency: [employer] Date DME Agency Contacted: 12/03/19 Time DME Agency Contacted: 1205 Representative spoke with at DME Agency: [place] ([place]) Interventions    Readmission Risk Interventions No flowsheet data found.

## 2019-12-04 NOTE — Progress Notes (Signed)
RT was called to remove pt from BiPAP. RT removed pt from BiPAP to [facility] tolerating well.

## 2019-12-05 DIAGNOSIS — J9611 Chronic respiratory failure with hypoxia: Secondary | ICD-10-CM

## 2019-12-05 DIAGNOSIS — J9621 Acute and chronic respiratory failure with hypoxia: Secondary | ICD-10-CM

## 2019-12-05 LAB — CBC WITH DIFFERENTIAL/PLATELET
Abs Immature Granulocytes: 0.03 10*3/uL (ref 0.00–0.07)
Basophils Absolute: 0 10*3/uL (ref 0.0–0.1)
Basophils Relative: 0 %
Eosinophils Absolute: 0 10*3/uL (ref 0.0–0.5)
Eosinophils Relative: 0 %
HCT: 49.5 % — ABNORMAL HIGH (ref 36.0–46.0)
Hemoglobin: 15.3 g/dL — ABNORMAL HIGH (ref 12.0–15.0)
Immature Granulocytes: 0 %
Lymphocytes Relative: 25 %
Lymphs Abs: 2.1 10*3/uL (ref 0.7–4.0)
MCH: 30.2 pg (ref 26.0–34.0)
MCHC: 30.9 g/dL (ref 30.0–36.0)
MCV: 97.8 fL (ref 80.0–100.0)
Monocytes Absolute: 0.8 10*3/uL (ref 0.1–1.0)
Monocytes Relative: 9 %
Neutro Abs: 5.7 10*3/uL (ref 1.7–7.7)
Neutrophils Relative %: 66 %
Platelets: 209 10*3/uL (ref 150–400)
RBC: 5.06 MIL/uL (ref 3.87–5.11)
RDW: 16.6 % — ABNORMAL HIGH (ref 11.5–15.5)
WBC: 8.7 10*3/uL (ref 4.0–10.5)
nRBC: 0 % (ref 0.0–0.2)

## 2019-12-05 LAB — COMPREHENSIVE METABOLIC PANEL
ALT: 47 U/L — ABNORMAL HIGH (ref 0–44)
AST: 37 U/L (ref 15–41)
Albumin: 3.4 g/dL — ABNORMAL LOW (ref 3.5–5.0)
Alkaline Phosphatase: 53 U/L (ref 38–126)
Anion gap: 9 (ref 5–15)
BUN: 38 mg/dL — ABNORMAL HIGH (ref 8–23)
CO2: 35 mmol/L — ABNORMAL HIGH (ref 22–32)
Calcium: 9.1 mg/dL (ref 8.9–10.3)
Chloride: 95 mmol/L — ABNORMAL LOW (ref 98–111)
Creatinine, Ser: 1.11 mg/dL — ABNORMAL HIGH (ref 0.44–1.00)
GFR calc Af Amer: 59 mL/min — ABNORMAL LOW (ref >60)
GFR calc non Af Amer: 51 mL/min — ABNORMAL LOW (ref >60)
Glucose, Bld: 89 mg/dL (ref 70–99)
Potassium: 4.5 mmol/L (ref 3.5–5.1)
Sodium: 139 mmol/L (ref 135–145)
Total Bilirubin: 0.8 mg/dL (ref 0.3–1.2)
Total Protein: 6.8 g/dL (ref 6.5–8.1)

## 2019-12-05 LAB — GLUCOSE, CAPILLARY
Glucose-Capillary: 89 mg/dL (ref 70–99)
Glucose-Capillary: 122 mg/dL — ABNORMAL HIGH (ref 70–99)
Glucose-Capillary: 180 mg/dL — ABNORMAL HIGH (ref 70–99)
Glucose-Capillary: 174 mg/dL — ABNORMAL HIGH (ref 70–99)

## 2019-12-05 LAB — MAGNESIUM: Magnesium: 2.1 mg/dL (ref 1.7–2.4)

## 2019-12-05 LAB — BRAIN NATRIURETIC PEPTIDE: B Natriuretic Peptide: 20.1 pg/mL (ref 0.0–100.0)

## 2019-12-05 NOTE — Progress Notes (Addendum)
PROGRESS NOTE                                                                                                                                                                                                             Patient Demographics:    Kelly Booker, is a 69 y.o. female, DOB - 04-27-1951, CWC:[id number]  Admit date - 11/30/2019   Admitting Physician Clance Boll, MD  Outpatient Primary MD for the patient is Patient, No Pcp Per  LOS - 5  Chief Complaint  Patient presents with  . no oxygen tank       Brief Narrative  69 yo Spanish speaking female arrived from [place] 4 days prior to admission with hx of COPD and hypoxia presented to ER because she didn't have oxygen set up and had increased dyspnea.  Admitted by hospitalist with COPD exacerbation and acute on chronic hypoxic/hypercapnic respiratory failure.  Developed altered mental status 6/27 and transferred to ICU for Bipap therapy, he was stabilized and was transferred on 3 L nasal cannula oxygen with nighttime BiPAP to the floor under my service on 12/03/2019.   Subjective:    [name] today denies any abdominal pain, nausea or vomiting, reports she had a good night sleep .   Assessment  & Plan :     1.  Acute on chronic hypoxic and hypercapnic respiratory failure due to COPD exacerbation.  Also has likely underlying OSA and OHS with metabolic encephalopathy.  She was admitted to ICU and treated by pulmonary critical care with continuous BiPAP, IV steroids and supplemental oxygen, her mentation has improved, respiratory status has improved as well, she was requiring BiPAP initially, yesterday she was tried on CPAP overnight and she did tolerate very well, will try CPAP without oxygen this evening, family awaiting her CPAP to be shipped from her home country [place] hopefully it should be here by tomorrow. - Continue steroid taper and supportive care with nebulizer treatments.  Added I-S and flutter  valve.  Echo shows chronic diastolic dysfunction with preserved EF of 60%.  Advance activity.  PT OT evaluation. -He remains hypoxic with activity, and with minimal hypoxia at rest, as well at nighttime she did become hypoxic only on CPAP without oxygen, so I have discussed with son, she will need oxygen at rest, with activity and with CPAP. -We will obtain CT chest without contrast for further evaluation.  2.  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  Echo stable.  3.  Morbid  obesity.  BMI greater than 35.  Follow with PCP for weight loss.  4.  DM type II.  Currently on sliding scale.  Stable, most likely upon discharge will require oral hypoglycemic, diabetic education will be ordered.  Lab Results  Component Value Date   HGBA1C 7.0 (H) 12/01/2019   CBG (last 3)  Recent Labs    12/04/19 2119 12/05/19 0720 12/05/19 1131  GLUCAP 127* 89 122*      Condition - Fair  Family Communication  : Discussed with son via phone 7/2  Code Status :  Full  Consults  :  PCCM  Procedures  :    TTE - 1. Left ventricular ejection fraction, by estimation, is 55 to 60%. The left ventricle has normal function. The left ventricle has no regional wall motion abnormalities. There is mild left ventricular hypertrophy. Left ventricular diastolic parameters are indeterminate.  2. Right ventricular systolic function is normal. The right ventricular size is normal.  3. The mitral valve is grossly normal. Trivial mitral valve regurgitation.  4. The aortic valve is tricuspid. Aortic valve regurgitation is not visualized. Mild aortic valve sclerosis is present, with no evidence of aortic valve stenosis   PUD Prophylaxis : PPI  Disposition Plan  :    Status is: Inpatient  Remains inpatient appropriate because:IV treatments appropriate due to intensity of illness or inability to take PO   Dispo: The patient is from: Home              Anticipated d/c is to: Home              Anticipated d/c date  is: 2 days              Patient currently is not medically stable to d/c.  DVT Prophylaxis  : Xarelto  Lab Results  Component Value Date   PLT 209 12/05/2019    Diet :  Diet Order            Diet heart healthy/carb modified Room service appropriate? No; Fluid consistency: Thin  Diet effective now                  Inpatient Medications Scheduled Meds: . chlorhexidine  15 mL Mouth Rinse BID  . Chlorhexidine Gluconate Cloth  6 each Topical Daily  . insulin aspart  0-20 Units Subcutaneous TID WC  . insulin aspart  0-5 Units Subcutaneous QHS  . ipratropium-albuterol  3 mL Nebulization BID  . mouth rinse  15 mL Mouth Rinse q12n4p  . pantoprazole  40 mg Oral Daily  . polyethylene glycol  17 g Oral Daily  . predniSONE  30 mg Oral Q breakfast  . rivaroxaban  20 mg Oral Q supper  . sodium chloride flush  3 mL Intravenous Q12H   Continuous Infusions:  PRN Meds:.albuterol, docusate sodium  Antibiotics  :   Anti-infectives (From admission, onward)   Start     Dose/Rate Route Frequency Ordered Stop   12/01/19 1600  azithromycin (ZITHROMAX) tablet 250 mg       "Followed by" Linked Group Details   250 mg Oral Daily 11/30/19 2355 12/04/19 1032   12/01/19 0830  cefTRIAXone (ROCEPHIN) 2 g in sodium chloride 0.9 % 100 mL IVPB  Status:  Discontinued        2 g 200 mL/hr over 30 Minutes Intravenous Every 24 hours 12/01/19 0735 12/02/19 0900   12/01/19 0000  azithromycin (ZITHROMAX) tablet 500 mg       "Followed  by" Linked Group Details   500 mg Oral Daily 11/30/19 2355 12/01/19 0128          Objective:   Vitals:   12/05/19 0403 12/05/19 0721 12/05/19 0734 12/05/19 1127  BP: (!) 96/54 126/72  103/61  Pulse: (!) 54 63  69  Resp: 15 16  14   Temp: (!) 97.5 F (36.4 C) (!) 97.5 F (36.4 C)  98 F (36.7 C)  TempSrc: Axillary Axillary  Axillary  SpO2: 93% 95% 95% 95%  Weight:        SpO2: 95 % O2 Flow Rate (L/min): 2 L/min FiO2 (%): 50 %  Wt Readings from Last 3  Encounters:  12/04/19 81.8 kg     Intake/Output Summary (Last 24 hours) at 12/05/2019 1346 Last data filed at 12/05/2019 0959 Gross per 24 hour  Intake 480 ml  Output --  Net 480 ml     Physical Exam  Awake Alert, Oriented X 3, No new F.N deficits, Normal affect Symmetrical Chest wall movement, Good air movement bilaterally, CTAB RRR,No Gallops,Rubs or new Murmurs, No Parasternal Heave +ve B.Sounds, Abd Soft, No tenderness, No rebound - guarding or rigidity. No Cyanosis, Clubbing or edema, No new Rash or bruise    Tele interpreter was used during evaluation     Data Review:    Recent Labs  Lab 11/30/19 2040 12/01/19 0014 12/01/19 0136 12/01/19 1143 12/03/19 0414 12/04/19 0507 12/05/19 0056  WBC 7.6  --  9.6  --  9.0 8.2 8.7  HGB 15.3*   < > 16.3* 18.7* 15.6* 15.2* 15.3*  HCT 52.3*   < > 55.2* 55.0* 51.1* 50.0* 49.5*  PLT 200  --  216  --  222 193 209  MCV 103.8*  --  102.6*  --  99.0 99.4 97.8  MCH 30.4  --  30.3  --  30.2 30.2 30.2  MCHC 29.3*  --  29.5*  --  30.5 30.4 30.9  RDW 18.0*  --  17.9*  --  17.2* 17.0* 16.6*  LYMPHSABS 2.2  --   --   --   --  3.1 2.1  MONOABS 0.7  --   --   --   --  0.8 0.8  EOSABS 0.1  --   --   --   --  0.0 0.0  BASOSABS 0.0  --   --   --   --  0.0 0.0   < > = values in this interval not displayed.    Recent Labs  Lab 11/30/19 2040 12/01/19 0014 12/01/19 0136 12/01/19 0136 12/01/19 0815 12/01/19 1143 12/02/19 0233 12/03/19 0414 12/03/19 0733 12/04/19 0507 12/05/19 0056  NA 142   < > 140   < > 141 143  --  143  --  139 139  K 5.1   < > 5.2*   < > 5.7* 5.1  --  4.1  --  4.3 4.5  CL 101   < > 94*  --  95*  --   --  94*  --  94* 95*  CO2 32   < > 35*  --  36*  --   --  39*  --  37* 35*  GLUCOSE 116*   < > 170*  --  172*  --   --  103*  --  102* 89  BUN 20   < > 20  --  22  --   --  41*  --  37* 38*  CREATININE  1.03*   < > 1.02*  --  1.18*  --   --  1.09*  --  1.10* 1.11*  CALCIUM 8.6*   < > 9.2  --  9.1  --   --  8.9  --   9.2 9.1  AST  --   --   --   --   --   --   --   --   --  36 37  ALT  --   --   --   --   --   --   --   --   --  38 47*  ALKPHOS  --   --   --   --   --   --   --   --   --  60 53  BILITOT  --   --   --   --   --   --   --   --   --  0.5 0.8  ALBUMIN  --   --   --   --   --   --   --   --   --  3.2* 3.4*  MG  --   --   --   --   --   --   --   --   --  2.1 2.1  DDIMER  --   --  0.42  --   --   --   --   --   --   --   --   PROCALCITON  --   --   --   --  <0.10  --  <0.10  --   --   --   --   INR  --   --  1.6*  --   --   --   --   --   --   --   --   TSH  --   --  2.401  --   --   --   --   --   --   --   --   HGBA1C  --   --   --   --  7.0*  --   --   --   --   --   --   BNP 69.2  --   --   --  56.7  --   --   --  26.9 20.3 20.1   < > = values in this interval not displayed.    Recent Labs  Lab 11/30/19 2040 11/30/19 2314 12/01/19 0136 12/01/19 0815 12/02/19 0233 12/03/19 0733 12/04/19 0507 12/05/19 0056  DDIMER  --   --  0.42  --   --   --   --   --   BNP 69.2  --   --  56.7  --  26.9 20.3 20.1  PROCALCITON  --   --   --  <0.10 <0.10  --   --   --   SARSCOV2NAA  --  NEGATIVE  --   --   --   --   --   --     ------------------------------------------------------------------------------------------------------------------ No results for input(s): CHOL, HDL, LDLCALC, TRIG, CHOLHDL, LDLDIRECT in the last 72 hours.  Lab Results  Component Value Date   HGBA1C 7.0 (H) 12/01/2019   ------------------------------------------------------------------------------------------------------------------ No results for input(s): TSH, T4TOTAL, T3FREE, THYROIDAB in the last 72 hours.  Invalid input(s): FREET3 ------------------------------------------------------------------------------------------------------------------ No results for input(s): VITAMINB12, FOLATE,  FERRITIN, TIBC, IRON, RETICCTPCT in the last 72 hours.  Coagulation profile Recent Labs  Lab 12/01/19 0136  INR 1.6*     No results for input(s): DDIMER in the last 72 hours.  Cardiac Enzymes No results for input(s): CKMB, TROPONINI, MYOGLOBIN in the last 168 hours.  Invalid input(s): CK ------------------------------------------------------------------------------------------------------------------    Component Value Date/Time   BNP 20.1 12/05/2019 0056    Micro Results Recent Results (from the past 240 hour(s))  SARS Coronavirus 2 by RT PCR (hospital order, performed in [facility] hospital lab) Nasopharyngeal Nasopharyngeal Swab     Status: None   Collection Time: 11/30/19 11:14 PM   Specimen: Nasopharyngeal Swab  Result Value Ref Range Status   SARS Coronavirus 2 NEGATIVE NEGATIVE Final    Comment: (NOTE) SARS-CoV-2 target nucleic acids are NOT DETECTED.  The SARS-CoV-2 RNA is generally detectable in upper and lower respiratory specimens during the acute phase of infection. The lowest concentration of SARS-CoV-2 viral copies this assay can detect is 250 copies / mL. A negative result does not preclude SARS-CoV-2 infection and should not be used as the sole basis for treatment or other patient management decisions.  A negative result may occur with improper specimen collection / handling, submission of specimen other than nasopharyngeal swab, presence of viral mutation(s) within the areas targeted by this assay, and inadequate number of viral copies (<250 copies / mL). A negative result must be combined with clinical observations, patient history, and epidemiological information.  Fact Sheet for Patients:   StrictlyIdeas.no  Fact Sheet for Healthcare Providers: BankingDealers.co.za  This test is not yet approved or  cleared by the [place] FDA and has been authorized for detection and/or diagnosis of SARS-CoV-2 by FDA under an Emergency Use Authorization (EUA).  This EUA will remain in effect (meaning this test can be used) for the  duration of the COVID-19 declaration under Section 564(b)(1) of the Act, 21 U.S.C. section 360bbb-3(b)(1), unless the authorization is terminated or revoked sooner.  Performed at [facility], [place] [address] [place], [place] [address]   MRSA PCR Screening     Status: None   Collection Time: 12/01/19  8:01 AM   Specimen: Nasal Mucosa; Nasopharyngeal  Result Value Ref Range Status   MRSA by PCR NEGATIVE NEGATIVE Final    Comment:        The GeneXpert MRSA Assay (FDA approved for NASAL specimens only), is one component of a comprehensive MRSA colonization surveillance program. It is not intended to diagnose MRSA infection nor to guide or monitor treatment for MRSA infections. Performed at [facility], [place] [address] [place], [place] [address]   Respiratory Panel by PCR     Status: None   Collection Time: 12/01/19  9:13 AM  Result Value Ref Range Status   Adenovirus NOT DETECTED NOT DETECTED Final   Coronavirus 229E NOT DETECTED NOT DETECTED Final    Comment: (NOTE) The Coronavirus on the Respiratory Panel, DOES NOT test for the novel  Coronavirus (2019 nCoV)    Coronavirus HKU1 NOT DETECTED NOT DETECTED Final   Coronavirus NL63 NOT DETECTED NOT DETECTED Final   Coronavirus OC43 NOT DETECTED NOT DETECTED Final   Metapneumovirus NOT DETECTED NOT DETECTED Final   Rhinovirus / Enterovirus NOT DETECTED NOT DETECTED Final   Influenza A NOT DETECTED NOT DETECTED Final   Influenza B NOT DETECTED NOT DETECTED Final   Parainfluenza Virus 1 NOT DETECTED NOT DETECTED Final   Parainfluenza Virus 2 NOT DETECTED  NOT DETECTED Final   Parainfluenza Virus 3 NOT DETECTED NOT DETECTED Final   Parainfluenza Virus 4 NOT DETECTED NOT DETECTED Final   Respiratory Syncytial Virus NOT DETECTED NOT DETECTED Final   Bordetella pertussis NOT DETECTED NOT DETECTED Final   Chlamydophila pneumoniae NOT DETECTED NOT DETECTED Final   Mycoplasma pneumoniae NOT DETECTED NOT DETECTED Final     Comment: Performed at [facility], [place] [address] [place], [place] [address]    Radiology Reports DG Chest 2 View  Result Date: 12/01/2019 CLINICAL DATA:  Acute onset shortness of breath, COPD, hypoxia EXAM: CHEST - 2 VIEW COMPARISON:  11/30/2019 FINDINGS: Frontal and lateral views of the chest demonstrate a stable cardiac silhouette. Stable prominence of the central pulmonary vasculature without airspace disease, effusion, or pneumothorax. Lungs are mildly hyperinflated consistent with emphysema. No acute bony abnormalities. IMPRESSION: 1. Stable emphysema. 2. Stable vascular congestion without overt edema. Electronically Signed   By: Randa Ngo M.D.   On: 12/01/2019 00:30   DG Chest 2 View  Result Date: 11/30/2019 CLINICAL DATA:  On oxygen did in at home in [place]. She was not allowed to bring her oxygen tank to the [place]. EXAM: CHEST - 2 VIEW COMPARISON:  None. FINDINGS: Enlarged cardiac silhouette. Mildly prominent pulmonary vasculature and interstitial markings without Kerley lines. Small amount of linear atelectasis or scarring in the left lower lung zone and right lung base. The lungs are mildly hyperexpanded. No pleural fluid. Diffuse osteopenia. IMPRESSION: 1. Cardiomegaly and mild pulmonary vascular congestion. 2. Mild changes of COPD. Electronically Signed   By: Claudie Revering M.D.   On: 11/30/2019 19:14   ECHOCARDIOGRAM COMPLETE  Result Date: 12/02/2019    ECHOCARDIOGRAM REPORT   Patient Name:   Kelly Booker Date of Exam: 12/02/2019 Medical Rec #:  [id number]   Height: Accession #:    [id number]  Weight:       177.2 lb Date of Birth:  1950/07/19    BSA:          1.863 m Patient Age:    82 years    BP:           92/53 mmHg Patient Gender: F           HR:           67 bpm. Exam Location:  Inpatient Procedure: 2D Echo, Cardiac Doppler, Color Doppler and Intracardiac            Opacification Agent Indications:    I50.9* Heart failure (unspecified)  History:        Patient has  no prior history of Echocardiogram examinations.                 COPD; Signs/Symptoms:Dyspnea, Altered Mental Status and                 Shortness of Breath. Hypoxia.  Sonographer:    Roseanna Rainbow RDCS Referring Phys: [id number] [facility] A THOMAS  Sonographer Comments: Technically difficult study due to poor echo windows. IMPRESSIONS  1. Left ventricular ejection fraction, by estimation, is 55 to 60%. The left ventricle has normal function. The left ventricle has no regional wall motion abnormalities. There is mild left ventricular hypertrophy. Left ventricular diastolic parameters are indeterminate.  2. Right ventricular systolic function is normal. The right ventricular size is normal.  3. The mitral valve is grossly normal. Trivial mitral valve regurgitation.  4. The aortic valve is tricuspid. Aortic valve regurgitation is not visualized. Mild aortic valve  sclerosis is present, with no evidence of aortic valve stenosis. FINDINGS  Left Ventricle: Left ventricular ejection fraction, by estimation, is 55 to 60%. The left ventricle has normal function. The left ventricle has no regional wall motion abnormalities. Definity contrast agent was given IV to delineate the left ventricular  endocardial borders. The left ventricular internal cavity size was normal in size. There is mild left ventricular hypertrophy. Left ventricular diastolic parameters are indeterminate. Right Ventricle: The right ventricular size is normal. Right vetricular wall thickness was not assessed. Right ventricular systolic function is normal. Left Atrium: Left atrial size was normal in size. Right Atrium: Right atrial size was normal in size. Pericardium: Trivial pericardial effusion is present. Mitral Valve: The mitral valve is grossly normal. Trivial mitral valve regurgitation. Tricuspid Valve: The tricuspid valve is grossly normal. Tricuspid valve regurgitation is trivial. Aortic Valve: The aortic valve is tricuspid. Aortic valve regurgitation is  not visualized. Mild aortic valve sclerosis is present, with no evidence of aortic valve stenosis. Pulmonic Valve: The pulmonic valve was not well visualized. Pulmonic valve regurgitation is trivial. Aorta: The aortic root is normal in size and structure. IAS/Shunts: No atrial level shunt detected by color flow Doppler.  LEFT VENTRICLE PLAX 2D LVIDd:         4.40 cm      Diastology LVIDs:         3.20 cm      LV e' lateral:   6.04 cm/s LV PW:         1.20 cm      LV E/e' lateral: 17.7 LV IVS:        1.10 cm      LV e' medial:    4.95 cm/s LVOT diam:     1.90 cm      LV E/e' medial:  21.6 LV SV:         69 LV SV Index:   37 LVOT Area:     2.84 cm  LV Volumes (MOD) LV vol d, MOD A2C: 91.2 ml LV vol d, MOD A4C: 117.0 ml LV vol s, MOD A2C: 39.8 ml LV vol s, MOD A4C: 47.3 ml LV SV MOD A2C:     51.4 ml LV SV MOD A4C:     117.0 ml LV SV MOD BP:      63.6 ml RIGHT VENTRICLE            IVC RV S prime:     6.74 cm/s  IVC diam: 2.30 cm TAPSE (M-mode): 1.6 cm LEFT ATRIUM             Index       RIGHT ATRIUM           Index LA diam:        3.60 cm 1.93 cm/m  RA Area:     15.90 cm LA Vol (A2C):   36.8 ml 19.75 ml/m RA Volume:   40.70 ml  21.85 ml/m LA Vol (A4C):   42.4 ml 22.76 ml/m LA Biplane Vol: 42.2 ml 22.65 ml/m  AORTIC VALVE LVOT Vmax:   112.00 cm/s LVOT Vmean:  66.500 cm/s LVOT VTI:    0.242 m  AORTA Ao Root diam: 3.50 cm Ao Asc diam:  3.70 cm MITRAL VALVE MV Area (PHT): 3.99 cm     SHUNTS MV Decel Time: 190 msec     Systemic VTI:  0.24 m MV E velocity: 107.00 cm/s  Systemic Diam: 1.90 cm MV A velocity: 114.00 cm/s MV  E/A ratio:  0.94 Dorris Carnes MD Electronically signed by Dorris Carnes MD Signature Date/Time: 12/02/2019/1:58:48 PM    Final       Phillips Climes M.D on 12/05/2019 at 1:46 PM  To page go to [email or web address] - password [facility]

## 2019-12-05 NOTE — Progress Notes (Signed)
RT attempted to place patient on CPAP with no additional oxygen. Patient desaturated in mid 80's without improvement. Patient placed on 35% fio2.

## 2019-12-05 NOTE — Progress Notes (Signed)
Physical Therapy Treatment Patient Details Name: Kelly Booker MRN: [id number] DOB: 01/09/1951 Today's Date: 12/05/2019    History of Present Illness 69 yo Spanish speaking female arrived from [place] 4 days prior to admission with hx of COPD and hypoxia presented to ER because she didn't have oxygen set up and had increased dyspnea.  Admitted by hospitalist with COPD exacerbation and acute on chronic hypoxic/hypercapnic respiratory failure.  Developed altered mental status 6/27 and transferred to ICU for Bipap therapy.    PT Comments    Patient progressing with mobility able to practice steps this session.  Still with some difficulty following commands per Spanish interpreter not understanding about steps when asked initially, difficulty using IS and flutter valve with multiple instructional cues needed.  She continues to desat even at rest on 2L O2, but not lower than 86%.  She was instructed that she will need O2 at d/c.  PT to continue to follow.  No follow up needs at d/c.    Follow Up Recommendations  No PT follow up     Equipment Recommendations  None recommended by PT    Recommendations for Other Services       Precautions / Restrictions Precautions Precautions: Fall Precaution Comments: watch O2 Restrictions Weight Bearing Restrictions: No    Mobility  Bed Mobility               General bed mobility comments: pt sitting in recliner upon arrival  Transfers Overall transfer level: Needs assistance Equipment used: None Transfers: Sit to/from Stand Sit to Stand: Supervision         General transfer comment: assist for lines & O2  Ambulation/Gait Ambulation/Gait assistance: Supervision Gait Distance (Feet): 250 Feet Assistive device: None Gait Pattern/deviations: Step-through pattern;WFL(Within Functional Limits)     General Gait Details: SpO2 in 80's intermittently at rest on 2L O2, cues for pursed lip breathing, up to 93% with  ambulation   Stairs Stairs: Yes Stairs assistance: Supervision Stair Management: One rail Right;Alternating pattern;Step to pattern;Forwards Number of Stairs: 10 General stair comments: step to pattern to descend, step through to ascend, carried O2 for her, pt steady on steps with one rail   Wheelchair Mobility    Modified Rankin (Stroke Patients Only)       Balance Overall balance assessment: No apparent balance deficits (not formally assessed)                                          Cognition Arousal/Alertness: Awake/alert Behavior During Therapy: WFL for tasks assessed/performed Overall Cognitive Status: Within Functional Limits for tasks assessed                                 General Comments: Cueing to move more slowly.  Educated on importance of maintaining SpO2 >90 and ways to compensate with energy conservation      Exercises      General Comments General comments (skin integrity, edema, etc.): Graceilla present for session as interpretor      Pertinent Vitals/Pain Pain Assessment: No/denies pain    Home Living                      Prior Function            PT Goals (current goals can now be found in the  care plan section) Acute Rehab PT Goals Patient Stated Goal: to get stronger  Progress towards PT goals: Progressing toward goals    Frequency    Min 3X/week      PT Plan Current plan remains appropriate    Co-evaluation              AM-PAC PT "6 Clicks" Mobility   Outcome Measure  Help needed turning from your back to your side while in a flat bed without using bedrails?: None Help needed moving from lying on your back to sitting on the side of a flat bed without using bedrails?: None Help needed moving to and from a bed to a chair (including a wheelchair)?: None Help needed standing up from a chair using your arms (e.g., wheelchair or bedside chair)?: None Help needed to walk in hospital  room?: None Help needed climbing 3-5 steps with a railing? : None 6 Click Score: 24    End of Session Equipment Utilized During Treatment: Oxygen Activity Tolerance: Patient tolerated treatment well Patient left: in chair;with call bell/phone within reach;with chair alarm set   PT Visit Diagnosis: Other abnormalities of gait and mobility (R26.89);Difficulty in walking, not elsewhere classified (R26.2)     Time: 1339-1400 PT Time Calculation (min) (ACUTE ONLY): 21 min  Charges:  $Gait Training: 8-22 mins                     Magda Kiel, PT Acute Rehabilitation Services [phone] [phone] 12/05/2019    Reginia Naas 12/05/2019, 5:59 PM

## 2019-12-05 NOTE — Progress Notes (Addendum)
Occupational Therapy Treatment Patient Details Name: Kelly Booker MRN: [id number] DOB: 1950-11-20 Today's Date: 12/05/2019    History of present illness 69 yo Spanish speaking female arrived from [place] 4 days prior to admission with hx of COPD and hypoxia presented to ER because she didn't have oxygen set up and had increased dyspnea.  Admitted by hospitalist with COPD exacerbation and acute on chronic hypoxic/hypercapnic respiratory failure.  Developed altered mental status 6/27 and transferred to ICU for Bipap therapy.   OT comments  Patient up in chair on arrival visiting with son.  Son served as interpreter throughout session which was useful for extra explanation and emphasis on importance of wearing oxygen.  Patient moves well, completing in room mobility and toileting with supervision (for lines), though still needs reminders to move more slowly.  Patient using 2L O2 [place] at rest with SpO2 ~94.  With mobility SpO2 decreased to 85 (though poor pleth), and increased to 89 with 4L O2.  Discussed energy conservation techniques for at home and in the community once discharged.  Will continue to follow with OT acutely to address the deficits listed below.    Follow Up Recommendations  No OT follow up;Supervision - Intermittent    Equipment Recommendations  3 in 1 bedside commode    Recommendations for Other Services      Precautions / Restrictions Precautions Precautions: Fall Precaution Comments: watch O2 Restrictions Weight Bearing Restrictions: No       Mobility Bed Mobility               General bed mobility comments: pt sitting in recliner upon arrival  Transfers Overall transfer level: Needs assistance Equipment used: None   Sit to Stand: Supervision              Balance Overall balance assessment: No apparent balance deficits (not formally assessed)                                         ADL either performed or assessed with clinical  judgement   ADL Overall ADL's : Needs assistance/impaired     Grooming: Wash/dry hands;Supervision/safety;Standing                   Toilet Transfer: Supervision/safety;Ambulation   Toileting- Clothing Manipulation and Hygiene: Supervision/safety;Sit to/from stand       Functional mobility during ADLs: Supervision/safety       Vision       Perception     Praxis      Cognition Arousal/Alertness: Awake/alert Behavior During Therapy: WFL for tasks assessed/performed Overall Cognitive Status: Within Functional Limits for tasks assessed                                 General Comments: Cueing to move more slowly.  Educated on importance of maintaining SpO2 >90 and ways to compensate with energy conservation        Exercises     Shoulder Instructions       General Comments Son present for session and served as interpretor     Pertinent Vitals/ Pain       Pain Assessment: No/denies pain  Home Living  Prior Functioning/Environment              Frequency  Min 2X/week        Progress Toward Goals  OT Goals(current goals can now be found in the care plan section)  Progress towards OT goals: Progressing toward goals  Acute Rehab OT Goals Patient Stated Goal: to get stronger  OT Goal Formulation: With patient/family Time For Goal Achievement: 12/16/19 Potential to Achieve Goals: Good  Plan Discharge plan remains appropriate    Co-evaluation                 AM-PAC OT "6 Clicks" Daily Activity     Outcome Measure   Help from another person eating meals?: None Help from another person taking care of personal grooming?: A Little Help from another person toileting, which includes using toliet, bedpan, or urinal?: A Little Help from another person bathing (including washing, rinsing, drying)?: A Little Help from another person to put on and taking off regular upper  body clothing?: None Help from another person to put on and taking off regular lower body clothing?: A Little 6 Click Score: 20    End of Session Equipment Utilized During Treatment: Oxygen  OT Visit Diagnosis: Other abnormalities of gait and mobility (R26.89)   Activity Tolerance Patient tolerated treatment well   Patient Left in chair;with call bell/phone within reach;with family/visitor present   Nurse Communication Mobility status        Time: 7782-4235 OT Time Calculation (min): 18 min  Charges: OT General Charges $OT Visit: 1 Visit OT Treatments $Self Care/Home Management : 8-22 mins  August Luz, OTR/L    Phylliss Bob 12/05/2019, 3:30 PM

## 2019-12-05 NOTE — Plan of Care (Signed)
Patient A&O x4. VSS. Pt on 2 L of oxygen sating above 93%. Lungs sounds regular and  Diminished. Free from falls. Pt turns self in bed. Ambulated to chair with one assist. Patient ambulated in hallway during shift and sat up in chair. Pt tolerating fluids and meals.  Pt voiding adequately during shift. DVT prophylactic: xarelto. No c/o of pain. Bed wheels locked. Phone and call bell within reach. Pt is resting, no distress. POC reviewed with son.  Problem: Education: Goal: Knowledge of General Education information will improve Description: Including pain rating scale, medication(s)/side effects and non-pharmacologic comfort measures Outcome: Progressing   Problem: Health Behavior/Discharge Planning: Goal: Ability to manage health-related needs will improve Outcome: Progressing   Problem: Clinical Measurements: Goal: Ability to maintain clinical measurements within normal limits will improve Outcome: Progressing Goal: Will remain free from infection Outcome: Progressing Goal: Diagnostic test results will improve Outcome: Progressing Goal: Respiratory complications will improve Outcome: Progressing Goal: Cardiovascular complication will be avoided Outcome: Progressing   Problem: Activity: Goal: Risk for activity intolerance will decrease Outcome: Progressing   Problem: Nutrition: Goal: Adequate nutrition will be maintained Outcome: Progressing   Problem: Coping: Goal: Level of anxiety will decrease Outcome: Progressing   Problem: Elimination: Goal: Will not experience complications related to bowel motility Outcome: Progressing Goal: Will not experience complications related to urinary retention Outcome: Progressing   Problem: Safety: Goal: Ability to remain free from injury will improve Outcome: Progressing   Problem: Skin Integrity: Goal: Risk for impaired skin integrity will decrease Outcome: Progressing

## 2019-12-05 NOTE — TOC Progression Note (Signed)
Transition of Care [facility]) - Progression Note    Patient Details  Name: Kelly Booker MRN: [id number] Date of Birth: [birth year]  Transition of Care [facility]) CM/SW [place], RN Phone Number: 12/05/2019, 12:14 PM  Clinical Narrative:    We are awaiting patients CPAP fed exed from [place], once this is here, she can be discharged to home. Has home o2 self pay delivered from [place] already on 6/30  Expected Discharge Plan: Home/Self Care Barriers to Discharge: Continued Medical Work up  Expected Discharge Plan and Services Expected Discharge Plan: Home/Self Care   Discharge Planning Services: CM Consult                     DME Arranged: NIV DME Agency: [employer] Date DME Agency Contacted: 12/03/19 Time DME Agency Contacted: [id number] Representative spoke with at DME Agency: [place] [facility]    Readmission Risk Interventions No flowsheet data found.

## 2019-12-06 ENCOUNTER — Inpatient Hospital Stay (HOSPITAL_COMMUNITY): Payer: Self-pay

## 2019-12-06 LAB — CBC WITH DIFFERENTIAL/PLATELET
Abs Immature Granulocytes: 0.02 10*3/uL (ref 0.00–0.07)
Basophils Absolute: 0 10*3/uL (ref 0.0–0.1)
Basophils Relative: 0 %
Eosinophils Absolute: 0 10*3/uL (ref 0.0–0.5)
Eosinophils Relative: 0 %
HCT: 49.6 % — ABNORMAL HIGH (ref 36.0–46.0)
Hemoglobin: 15.3 g/dL — ABNORMAL HIGH (ref 12.0–15.0)
Immature Granulocytes: 0 %
Lymphocytes Relative: 30 %
Lymphs Abs: 2.9 10*3/uL (ref 0.7–4.0)
MCH: 29.9 pg (ref 26.0–34.0)
MCHC: 30.8 g/dL (ref 30.0–36.0)
MCV: 96.9 fL (ref 80.0–100.0)
Monocytes Absolute: 0.8 10*3/uL (ref 0.1–1.0)
Monocytes Relative: 8 %
Neutro Abs: 6 10*3/uL (ref 1.7–7.7)
Neutrophils Relative %: 62 %
Platelets: 211 10*3/uL (ref 150–400)
RBC: 5.12 MIL/uL — ABNORMAL HIGH (ref 3.87–5.11)
RDW: 16.5 % — ABNORMAL HIGH (ref 11.5–15.5)
WBC: 9.7 10*3/uL (ref 4.0–10.5)
nRBC: 0 % (ref 0.0–0.2)

## 2019-12-06 LAB — COMPREHENSIVE METABOLIC PANEL
ALT: 46 U/L — ABNORMAL HIGH (ref 0–44)
AST: 28 U/L (ref 15–41)
Albumin: 3.4 g/dL — ABNORMAL LOW (ref 3.5–5.0)
Alkaline Phosphatase: 58 U/L (ref 38–126)
Anion gap: 10 (ref 5–15)
BUN: 37 mg/dL — ABNORMAL HIGH (ref 8–23)
CO2: 33 mmol/L — ABNORMAL HIGH (ref 22–32)
Calcium: 9.1 mg/dL (ref 8.9–10.3)
Chloride: 99 mmol/L (ref 98–111)
Creatinine, Ser: 1.13 mg/dL — ABNORMAL HIGH (ref 0.44–1.00)
GFR calc Af Amer: 58 mL/min — ABNORMAL LOW (ref >60)
GFR calc non Af Amer: 50 mL/min — ABNORMAL LOW (ref >60)
Glucose, Bld: 95 mg/dL (ref 70–99)
Potassium: 4.5 mmol/L (ref 3.5–5.1)
Sodium: 142 mmol/L (ref 135–145)
Total Bilirubin: 0.9 mg/dL (ref 0.3–1.2)
Total Protein: 6.8 g/dL (ref 6.5–8.1)

## 2019-12-06 LAB — GLUCOSE, CAPILLARY
Glucose-Capillary: 85 mg/dL (ref 70–99)
Glucose-Capillary: 114 mg/dL — ABNORMAL HIGH (ref 70–99)
Glucose-Capillary: 183 mg/dL — ABNORMAL HIGH (ref 70–99)
Glucose-Capillary: 100 mg/dL — ABNORMAL HIGH (ref 70–99)

## 2019-12-06 LAB — MAGNESIUM: Magnesium: 2.2 mg/dL (ref 1.7–2.4)

## 2019-12-06 LAB — BRAIN NATRIURETIC PEPTIDE: B Natriuretic Peptide: 27.3 pg/mL (ref 0.0–100.0)

## 2019-12-06 NOTE — Progress Notes (Signed)
O2 sat 82% [place] on ambulation O2 sat 92% on 2 liters via [place] on ambulation

## 2019-12-06 NOTE — TOC Progression Note (Signed)
Transition of Care [facility]) - Progression Note    Patient Details  Name: Kelly Booker MRN: [id number] Date of Birth: 1951-04-18  Transition of Care [facility]) CM/SW [place], RN Phone Number: 12/06/2019, 3:00 PM  Clinical Narrative:      Having a CTA to R/O PE today if all negative should go home in AM still awaiting CPAP from [place], should arrive tomorrow.   Home o2 already arranged. NO further needs.   Expected Discharge Plan: Home/Self Care Barriers to Discharge: Continued Medical Work up  Expected Discharge Plan and Services Expected Discharge Plan: Home/Self Care   Discharge Planning Services: CM Consult                     DME Arranged: NIV DME Agency: [employer] Date DME Agency Contacted: 12/03/19 Time DME Agency Contacted: 1205 Representative spoke with at DME Agency: [place] ([place] Interventions    Readmission Risk Interventions No flowsheet data found.

## 2019-12-06 NOTE — Progress Notes (Signed)
PROGRESS NOTE                                                                                                                                                                                                             Patient Demographics:    Kelly Booker, is a 69 y.o. female, DOB - 05/27/1951, BJS:[id number]  Admit date - 11/30/2019   Admitting Physician Clance Boll, MD  Outpatient Primary MD for the patient is Patient, No Pcp Per  LOS - 6  Chief Complaint  Patient presents with  . no oxygen tank       Brief Narrative  69 yo Spanish speaking female arrived from [place] 4 days prior to admission with hx of COPD and hypoxia presented to ER because she didn't have oxygen set up and had increased dyspnea.  Admitted by hospitalist with COPD exacerbation and acute on chronic hypoxic/hypercapnic respiratory failure.  Developed altered mental status 6/27 and transferred to ICU for Bipap therapy, he was stabilized and was transferred on 3 L nasal cannula oxygen with nighttime BiPAP to the floor under my service on 12/03/2019.   Subjective:    [name] today denies any abdominal pain, nausea or vomiting, reports she had a good night sleep .   Assessment  & Plan :     1.  Acute on chronic hypoxic and hypercapnic respiratory failure due to COPD exacerbation.  Also has likely underlying OSA and OHS with metabolic encephalopathy.  She was admitted to ICU and treated by pulmonary critical care with continuous BiPAP, IV steroids and supplemental oxygen, her mentation has improved, respiratory status has improved as well, she was requiring BiPAP initially, yesterday she was tried on CPAP overnight and she did tolerate very well, will try CPAP without oxygen this evening, family awaiting her CPAP to be shipped from her home country [place] hopefully it should be here by tomorrow. - Continue steroid taper and supportive care with nebulizer treatments.  Added I-S and flutter  valve.  Echo shows chronic diastolic dysfunction with preserved EF of 60%.  Advance activity.  PT OT evaluation. -He remains hypoxic with activity, and with minimal hypoxia at rest, as well at nighttime she did become hypoxic only on CPAP without oxygen, so I have discussed with son, she will need oxygen at rest, with activity and with CPAP. -We will obtain CT chest without contrast for further evaluation.  2.  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  Echo stable.  3.  Morbid  obesity.  BMI greater than 35.  Follow with PCP for weight loss.  4.  DM type II.  Currently on sliding scale.  Stable, most likely upon discharge will require oral hypoglycemic, diabetic education will be ordered.  Lab Results  Component Value Date   HGBA1C 7.0 (H) 12/01/2019   CBG (last 3)  Recent Labs    12/05/19 2113 12/06/19 0800 12/06/19 1138  GLUCAP 174* 85 114*      Condition - Fair  Family Communication  : Discussed with son via phone 7/2  Code Status :  Full  Consults  :  PCCM  Procedures  :    TTE - 1. Left ventricular ejection fraction, by estimation, is 55 to 60%. The left ventricle has normal function. The left ventricle has no regional wall motion abnormalities. There is mild left ventricular hypertrophy. Left ventricular diastolic parameters are indeterminate.  2. Right ventricular systolic function is normal. The right ventricular size is normal.  3. The mitral valve is grossly normal. Trivial mitral valve regurgitation.  4. The aortic valve is tricuspid. Aortic valve regurgitation is not visualized. Mild aortic valve sclerosis is present, with no evidence of aortic valve stenosis   PUD Prophylaxis : PPI  Disposition Plan  :    Status is: Inpatient  Remains inpatient appropriate because:IV treatments appropriate due to intensity of illness or inability to take PO   Dispo: The patient is from: Home              Anticipated d/c is to: Home              Anticipated d/c date  is: 2 days              Patient currently is not medically stable to d/c.  DVT Prophylaxis  : Xarelto  Lab Results  Component Value Date   PLT 211 12/06/2019    Diet :  Diet Order            Diet heart healthy/carb modified Room service appropriate? No; Fluid consistency: Thin  Diet effective now                  Inpatient Medications Scheduled Meds: . chlorhexidine  15 mL Mouth Rinse BID  . Chlorhexidine Gluconate Cloth  6 each Topical Daily  . insulin aspart  0-20 Units Subcutaneous TID WC  . insulin aspart  0-5 Units Subcutaneous QHS  . ipratropium-albuterol  3 mL Nebulization BID  . mouth rinse  15 mL Mouth Rinse q12n4p  . pantoprazole  40 mg Oral Daily  . polyethylene glycol  17 g Oral Daily  . predniSONE  30 mg Oral Q breakfast  . rivaroxaban  20 mg Oral Q supper  . sodium chloride flush  3 mL Intravenous Q12H   Continuous Infusions:  PRN Meds:.albuterol, docusate sodium  Antibiotics  :   Anti-infectives (From admission, onward)   Start     Dose/Rate Route Frequency Ordered Stop   12/01/19 1600  azithromycin (ZITHROMAX) tablet 250 mg       "Followed by" Linked Group Details   250 mg Oral Daily 11/30/19 2355 12/04/19 1032   12/01/19 0830  cefTRIAXone (ROCEPHIN) 2 g in sodium chloride 0.9 % 100 mL IVPB  Status:  Discontinued        2 g 200 mL/hr over 30 Minutes Intravenous Every 24 hours 12/01/19 0735 12/02/19 0900   12/01/19 0000  azithromycin (ZITHROMAX) tablet 500 mg       "Followed  by" Linked Group Details   500 mg Oral Daily 11/30/19 2355 12/01/19 0128          Objective:   Vitals:   12/06/19 0700 12/06/19 0810 12/06/19 0913 12/06/19 1135  BP:  127/74  112/67  Pulse:  62 (!) 59 81  Resp:  16 17 17   Temp:  98.9 F (37.2 C)  98.9 F (37.2 C)  TempSrc:  Oral  Oral  SpO2: 98% 92% 97% (!) 80%  Weight:      Height:        SpO2: (!) 80 % O2 Flow Rate (L/min): 3 L/min FiO2 (%): 35 %  Wt Readings from Last 3 Encounters:  12/06/19 80.6 kg      Intake/Output Summary (Last 24 hours) at 12/06/2019 1434 Last data filed at 12/05/2019 1800 Gross per 24 hour  Intake 300 ml  Output --  Net 300 ml     Physical Exam  Awake Alert, Oriented X 3, No new F.N deficits, Normal affect Symmetrical Chest wall movement, Good air movement bilaterally, CTAB RRR,No Gallops,Rubs or new Murmurs, No Parasternal Heave +ve B.Sounds, Abd Soft, No tenderness, No rebound - guarding or rigidity. No Cyanosis, Clubbing or edema, No new Rash or bruise    Tele interpreter was used during evaluation     Data Review:    Recent Labs  Lab 11/30/19 2040 12/01/19 0014 12/01/19 0136 12/01/19 0136 12/01/19 1143 12/03/19 0414 12/04/19 0507 12/05/19 0056 12/06/19 0241  WBC 7.6   < > 9.6  --   --  9.0 8.2 8.7 9.7  HGB 15.3*   < > 16.3*   < > 18.7* 15.6* 15.2* 15.3* 15.3*  HCT 52.3*   < > 55.2*   < > 55.0* 51.1* 50.0* 49.5* 49.6*  PLT 200   < > 216  --   --  222 193 209 211  MCV 103.8*   < > 102.6*  --   --  99.0 99.4 97.8 96.9  MCH 30.4   < > 30.3  --   --  30.2 30.2 30.2 29.9  MCHC 29.3*   < > 29.5*  --   --  30.5 30.4 30.9 30.8  RDW 18.0*   < > 17.9*  --   --  17.2* 17.0* 16.6* 16.5*  LYMPHSABS 2.2  --   --   --   --   --  3.1 2.1 2.9  MONOABS 0.7  --   --   --   --   --  0.8 0.8 0.8  EOSABS 0.1  --   --   --   --   --  0.0 0.0 0.0  BASOSABS 0.0  --   --   --   --   --  0.0 0.0 0.0   < > = values in this interval not displayed.    Recent Labs  Lab 11/30/19 2040 12/01/19 0136 12/01/19 0815 12/01/19 0815 12/01/19 1143 12/02/19 0233 12/03/19 0414 12/03/19 0733 12/04/19 0507 12/05/19 0056 12/06/19 0241  NA   < > 140 141   < > 143  --  143  --  139 139 142  K   < > 5.2* 5.7*   < > 5.1  --  4.1  --  4.3 4.5 4.5  CL   < > 94* 95*  --   --   --  94*  --  94* 95* 99  CO2   < > 35* 36*  --   --   --  39*  --  37* 35* 33*  GLUCOSE   < > 170* 172*  --   --   --  103*  --  102* 89 95  BUN   < > 20 22  --   --   --  41*  --  37* 38* 37*   CREATININE   < > 1.02* 1.18*  --   --   --  1.09*  --  1.10* 1.11* 1.13*  CALCIUM   < > 9.2 9.1  --   --   --  8.9  --  9.2 9.1 9.1  AST  --   --   --   --   --   --   --   --  36 37 28  ALT  --   --   --   --   --   --   --   --  38 47* 46*  ALKPHOS  --   --   --   --   --   --   --   --  60 53 58  BILITOT  --   --   --   --   --   --   --   --  0.5 0.8 0.9  ALBUMIN  --   --   --   --   --   --   --   --  3.2* 3.4* 3.4*  MG  --   --   --   --   --   --   --   --  2.1 2.1 2.2  DDIMER  --  0.42  --   --   --   --   --   --   --   --   --   PROCALCITON  --   --  <0.10  --   --  <0.10  --   --   --   --   --   INR  --  1.6*  --   --   --   --   --   --   --   --   --   TSH  --  2.401  --   --   --   --   --   --   --   --   --   HGBA1C  --   --  7.0*  --   --   --   --   --   --   --   --   BNP   < >  --  56.7  --   --   --   --  26.9 20.3 20.1 27.3   < > = values in this interval not displayed.    Recent Labs  Lab 11/30/19 2040 11/30/19 2314 12/01/19 0136 12/01/19 0815 12/02/19 0233 12/03/19 1287 12/04/19 0507 12/05/19 0056 12/06/19 0241  DDIMER  --   --  0.42  --   --   --   --   --   --   BNP   < >  --   --  56.7  --  26.9 20.3 20.1 27.3  PROCALCITON  --   --   --  <0.10 <0.10  --   --   --   --   SARSCOV2NAA  --  NEGATIVE  --   --   --   --   --   --   --    < > =  values in this interval not displayed.    ------------------------------------------------------------------------------------------------------------------ No results for input(s): CHOL, HDL, LDLCALC, TRIG, CHOLHDL, LDLDIRECT in the last 72 hours.  Lab Results  Component Value Date   HGBA1C 7.0 (H) 12/01/2019   ------------------------------------------------------------------------------------------------------------------ No results for input(s): TSH, T4TOTAL, T3FREE, THYROIDAB in the last 72 hours.  Invalid input(s):  FREET3 ------------------------------------------------------------------------------------------------------------------ No results for input(s): VITAMINB12, FOLATE, FERRITIN, TIBC, IRON, RETICCTPCT in the last 72 hours.  Coagulation profile Recent Labs  Lab 12/01/19 0136  INR 1.6*    No results for input(s): DDIMER in the last 72 hours.  Cardiac Enzymes No results for input(s): CKMB, TROPONINI, MYOGLOBIN in the last 168 hours.  Invalid input(s): CK ------------------------------------------------------------------------------------------------------------------    Component Value Date/Time   BNP 27.3 12/06/2019 0241    Micro Results Recent Results (from the past 240 hour(s))  SARS Coronavirus 2 by RT PCR (hospital order, performed in [facility] hospital lab) Nasopharyngeal Nasopharyngeal Swab     Status: None   Collection Time: 11/30/19 11:14 PM   Specimen: Nasopharyngeal Swab  Result Value Ref Range Status   SARS Coronavirus 2 NEGATIVE NEGATIVE Final    Comment: (NOTE) SARS-CoV-2 target nucleic acids are NOT DETECTED.  The SARS-CoV-2 RNA is generally detectable in upper and lower respiratory specimens during the acute phase of infection. The lowest concentration of SARS-CoV-2 viral copies this assay can detect is 250 copies / mL. A negative result does not preclude SARS-CoV-2 infection and should not be used as the sole basis for treatment or other patient management decisions.  A negative result may occur with improper specimen collection / handling, submission of specimen other than nasopharyngeal swab, presence of viral mutation(s) within the areas targeted by this assay, and inadequate number of viral copies (<250 copies / mL). A negative result must be combined with clinical observations, patient history, and epidemiological information.  Fact Sheet for Patients:   StrictlyIdeas.no  Fact Sheet for Healthcare  Providers: BankingDealers.co.za  This test is not yet approved or  cleared by the [place] FDA and has been authorized for detection and/or diagnosis of SARS-CoV-2 by FDA under an Emergency Use Authorization (EUA).  This EUA will remain in effect (meaning this test can be used) for the duration of the COVID-19 declaration under Section 564(b)(1) of the Act, 21 U.S.C. section 360bbb-3(b)(1), unless the authorization is terminated or revoked sooner.  Performed at [facility], [place] [address], [place], [place] [address]   MRSA PCR Screening     Status: None   Collection Time: 12/01/19  8:01 AM   Specimen: Nasal Mucosa; Nasopharyngeal  Result Value Ref Range Status   MRSA by PCR NEGATIVE NEGATIVE Final    Comment:        The GeneXpert MRSA Assay (FDA approved for NASAL specimens only), is one component of a comprehensive MRSA colonization surveillance program. It is not intended to diagnose MRSA infection nor to guide or monitor treatment for MRSA infections. Performed at [facility], [place] [address] [place],  [address]   Respiratory Panel by PCR     Status: None   Collection Time: 12/01/19  9:13 AM  Result Value Ref Range Status   Adenovirus NOT DETECTED NOT DETECTED Final   Coronavirus 229E NOT DETECTED NOT DETECTED Final    Comment: (NOTE) The Coronavirus on the Respiratory Panel, DOES NOT test for the novel  Coronavirus (2019 nCoV)    Coronavirus HKU1 NOT DETECTED NOT DETECTED Final   Coronavirus NL63 NOT DETECTED  NOT DETECTED Final   Coronavirus OC43 NOT DETECTED NOT DETECTED Final   Metapneumovirus NOT DETECTED NOT DETECTED Final   Rhinovirus / Enterovirus NOT DETECTED NOT DETECTED Final   Influenza A NOT DETECTED NOT DETECTED Final   Influenza B NOT DETECTED NOT DETECTED Final   Parainfluenza Virus 1 NOT DETECTED NOT DETECTED Final   Parainfluenza Virus 2 NOT DETECTED NOT DETECTED Final   Parainfluenza Virus 3 NOT  DETECTED NOT DETECTED Final   Parainfluenza Virus 4 NOT DETECTED NOT DETECTED Final   Respiratory Syncytial Virus NOT DETECTED NOT DETECTED Final   Bordetella pertussis NOT DETECTED NOT DETECTED Final   Chlamydophila pneumoniae NOT DETECTED NOT DETECTED Final   Mycoplasma pneumoniae NOT DETECTED NOT DETECTED Final    Comment: Performed at [facility], [place] [address] [place], [place] [address]    Radiology Reports DG Chest 2 View  Result Date: 12/01/2019 CLINICAL DATA:  Acute onset shortness of breath, COPD, hypoxia EXAM: CHEST - 2 VIEW COMPARISON:  11/30/2019 FINDINGS: Frontal and lateral views of the chest demonstrate a stable cardiac silhouette. Stable prominence of the central pulmonary vasculature without airspace disease, effusion, or pneumothorax. Lungs are mildly hyperinflated consistent with emphysema. No acute bony abnormalities. IMPRESSION: 1. Stable emphysema. 2. Stable vascular congestion without overt edema. Electronically Signed   By: Randa Ngo M.D.   On: 12/01/2019 00:30   DG Chest 2 View  Result Date: 11/30/2019 CLINICAL DATA:  On oxygen did in at home in [place]. She was not allowed to bring her oxygen tank to the [place]. EXAM: CHEST - 2 VIEW COMPARISON:  None. FINDINGS: Enlarged cardiac silhouette. Mildly prominent pulmonary vasculature and interstitial markings without Kerley lines. Small amount of linear atelectasis or scarring in the left lower lung zone and right lung base. The lungs are mildly hyperexpanded. No pleural fluid. Diffuse osteopenia. IMPRESSION: 1. Cardiomegaly and mild pulmonary vascular congestion. 2. Mild changes of COPD. Electronically Signed   By: Claudie Revering M.D.   On: 11/30/2019 19:14   ECHOCARDIOGRAM COMPLETE  Result Date: 12/02/2019    ECHOCARDIOGRAM REPORT   Patient Name:   AGAPITA SAVARINO Date of Exam: 12/02/2019 Medical Rec #:  [id number]   Height: Accession #:    [id number]  Weight:       177.2 lb Date of Birth:  1951/02/11    BSA:           1.863 m Patient Age:    26 years    BP:           92/53 mmHg Patient Gender: F           HR:           67 bpm. Exam Location:  Inpatient Procedure: 2D Echo, Cardiac Doppler, Color Doppler and Intracardiac            Opacification Agent Indications:    I50.9* Heart failure (unspecified)  History:        Patient has no prior history of Echocardiogram examinations.                 COPD; Signs/Symptoms:Dyspnea, Altered Mental Status and                 Shortness of Breath. Hypoxia.  Sonographer:    Roseanna Rainbow RDCS Referring Phys: [id number] [facility]  Sonographer Comments: Technically difficult study due to poor echo windows. IMPRESSIONS  1. Left ventricular ejection fraction, by estimation, is 55 to 60%. The left ventricle has  normal function. The left ventricle has no regional wall motion abnormalities. There is mild left ventricular hypertrophy. Left ventricular diastolic parameters are indeterminate.  2. Right ventricular systolic function is normal. The right ventricular size is normal.  3. The mitral valve is grossly normal. Trivial mitral valve regurgitation.  4. The aortic valve is tricuspid. Aortic valve regurgitation is not visualized. Mild aortic valve sclerosis is present, with no evidence of aortic valve stenosis. FINDINGS  Left Ventricle: Left ventricular ejection fraction, by estimation, is 55 to 60%. The left ventricle has normal function. The left ventricle has no regional wall motion abnormalities. Definity contrast agent was given IV to delineate the left ventricular  endocardial borders. The left ventricular internal cavity size was normal in size. There is mild left ventricular hypertrophy. Left ventricular diastolic parameters are indeterminate. Right Ventricle: The right ventricular size is normal. Right vetricular wall thickness was not assessed. Right ventricular systolic function is normal. Left Atrium: Left atrial size was normal in size. Right Atrium: Right atrial size was normal  in size. Pericardium: Trivial pericardial effusion is present. Mitral Valve: The mitral valve is grossly normal. Trivial mitral valve regurgitation. Tricuspid Valve: The tricuspid valve is grossly normal. Tricuspid valve regurgitation is trivial. Aortic Valve: The aortic valve is tricuspid. Aortic valve regurgitation is not visualized. Mild aortic valve sclerosis is present, with no evidence of aortic valve stenosis. Pulmonic Valve: The pulmonic valve was not well visualized. Pulmonic valve regurgitation is trivial. Aorta: The aortic root is normal in size and structure. IAS/Shunts: No atrial level shunt detected by color flow Doppler.  LEFT VENTRICLE PLAX 2D LVIDd:         4.40 cm      Diastology LVIDs:         3.20 cm      LV e' lateral:   6.04 cm/s LV PW:         1.20 cm      LV E/e' lateral: 17.7 LV IVS:        1.10 cm      LV e' medial:    4.95 cm/s LVOT diam:     1.90 cm      LV E/e' medial:  21.6 LV SV:         69 LV SV Index:   37 LVOT Area:     2.84 cm  LV Volumes (MOD) LV vol d, MOD A2C: 91.2 ml LV vol d, MOD A4C: 117.0 ml LV vol s, MOD A2C: 39.8 ml LV vol s, MOD A4C: 47.3 ml LV SV MOD A2C:     51.4 ml LV SV MOD A4C:     117.0 ml LV SV MOD BP:      63.6 ml RIGHT VENTRICLE            IVC RV S prime:     6.74 cm/s  IVC diam: 2.30 cm TAPSE (M-mode): 1.6 cm LEFT ATRIUM             Index       RIGHT ATRIUM           Index LA diam:        3.60 cm 1.93 cm/m  RA Area:     15.90 cm LA Vol (A2C):   36.8 ml 19.75 ml/m RA Volume:   40.70 ml  21.85 ml/m LA Vol (A4C):   42.4 ml 22.76 ml/m LA Biplane Vol: 42.2 ml 22.65 ml/m  AORTIC VALVE LVOT Vmax:   112.00 cm/s LVOT Vmean:  66.500 cm/s LVOT VTI:    0.242 m  AORTA Ao Root diam: 3.50 cm Ao Asc diam:  3.70 cm MITRAL VALVE MV Area (PHT): 3.99 cm     SHUNTS MV Decel Time: 190 msec     Systemic VTI:  0.24 m MV E velocity: 107.00 cm/s  Systemic Diam: 1.90 cm MV A velocity: 114.00 cm/s MV E/A ratio:  0.94 Dorris Carnes MD Electronically signed by Dorris Carnes MD Signature  Date/Time: 12/02/2019/1:58:48 PM    Final       Phillips Climes M.D on 12/06/2019 at 2:34 PM  To page go to [email or web address] - password [facility]

## 2019-12-07 LAB — CBC WITH DIFFERENTIAL/PLATELET
Abs Immature Granulocytes: 0.02 10*3/uL (ref 0.00–0.07)
Basophils Absolute: 0 10*3/uL (ref 0.0–0.1)
Basophils Relative: 0 %
Eosinophils Absolute: 0 10*3/uL (ref 0.0–0.5)
Eosinophils Relative: 0 %
HCT: 49.1 % — ABNORMAL HIGH (ref 36.0–46.0)
Hemoglobin: 15 g/dL (ref 12.0–15.0)
Immature Granulocytes: 0 %
Lymphocytes Relative: 36 %
Lymphs Abs: 3.4 10*3/uL (ref 0.7–4.0)
MCH: 30 pg (ref 26.0–34.0)
MCHC: 30.5 g/dL (ref 30.0–36.0)
MCV: 98.2 fL (ref 80.0–100.0)
Monocytes Absolute: 0.8 10*3/uL (ref 0.1–1.0)
Monocytes Relative: 8 %
Neutro Abs: 5.2 10*3/uL (ref 1.7–7.7)
Neutrophils Relative %: 56 %
Platelets: 206 10*3/uL (ref 150–400)
RBC: 5 MIL/uL (ref 3.87–5.11)
RDW: 16.7 % — ABNORMAL HIGH (ref 11.5–15.5)
WBC: 9.5 10*3/uL (ref 4.0–10.5)
nRBC: 0 % (ref 0.0–0.2)

## 2019-12-07 LAB — BRAIN NATRIURETIC PEPTIDE: B Natriuretic Peptide: 19.1 pg/mL (ref 0.0–100.0)

## 2019-12-07 LAB — COMPREHENSIVE METABOLIC PANEL
ALT: 52 U/L — ABNORMAL HIGH (ref 0–44)
AST: 32 U/L (ref 15–41)
Albumin: 3.3 g/dL — ABNORMAL LOW (ref 3.5–5.0)
Alkaline Phosphatase: 56 U/L (ref 38–126)
Anion gap: 10 (ref 5–15)
BUN: 33 mg/dL — ABNORMAL HIGH (ref 8–23)
CO2: 31 mmol/L (ref 22–32)
Calcium: 9.1 mg/dL (ref 8.9–10.3)
Chloride: 99 mmol/L (ref 98–111)
Creatinine, Ser: 0.98 mg/dL (ref 0.44–1.00)
GFR calc Af Amer: >60 mL/min (ref >60)
GFR calc non Af Amer: 59 mL/min — ABNORMAL LOW (ref >60)
Glucose, Bld: 97 mg/dL (ref 70–99)
Potassium: 4.4 mmol/L (ref 3.5–5.1)
Sodium: 140 mmol/L (ref 135–145)
Total Bilirubin: 0.8 mg/dL (ref 0.3–1.2)
Total Protein: 6.6 g/dL (ref 6.5–8.1)

## 2019-12-07 LAB — GLUCOSE, CAPILLARY
Glucose-Capillary: 99 mg/dL (ref 70–99)
Glucose-Capillary: 97 mg/dL (ref 70–99)
Glucose-Capillary: 136 mg/dL — ABNORMAL HIGH (ref 70–99)
Glucose-Capillary: 173 mg/dL — ABNORMAL HIGH (ref 70–99)

## 2019-12-07 LAB — MAGNESIUM: Magnesium: 2.3 mg/dL (ref 1.7–2.4)

## 2019-12-07 MED ORDER — PREDNISONE 20 MG PO TABS
20.0000 mg | ORAL_TABLET | Freq: Every day | ORAL | Status: DC
  Administered 2019-12-08: 20 mg via ORAL
  Filled 2019-12-07: qty 1

## 2019-12-07 MED ORDER — IPRATROPIUM-ALBUTEROL 0.5-2.5 (3) MG/3ML IN SOLN: 3.0000 mL | Freq: Four times a day (QID) | RESPIRATORY_TRACT | Status: DC | PRN

## 2019-12-07 MED ORDER — FLUTICASONE FUROATE-VILANTEROL 100-25 MCG/INH IN AEPB
1.0000 | INHALATION_SPRAY | Freq: Every day | RESPIRATORY_TRACT | Status: DC
  Administered 2019-12-08 – 2019-12-10 (×3): 1 via RESPIRATORY_TRACT
  Filled 2019-12-07: qty 28

## 2019-12-07 NOTE — Progress Notes (Signed)
PROGRESS NOTE                                                                                                                                                                                                             Patient Demographics:    Kelly Booker, is a 69 y.o. female, DOB - 1950-08-25, EAV:[id number]  Admit date - 11/30/2019   Admitting Physician Clance Boll, MD  Outpatient Primary MD for the patient is Patient, No Pcp Per  LOS - 7  Chief Complaint  Patient presents with  . no oxygen tank       Brief Narrative  69 yo Spanish speaking female arrived from [place] 4 days prior to admission with hx of COPD and hypoxia presented to ER because she didn't have oxygen set up and had increased dyspnea.  Admitted by hospitalist with COPD exacerbation and acute on chronic hypoxic/hypercapnic respiratory failure.  Developed altered mental status 6/27 and transferred to ICU for Bipap therapy, he was stabilized and was transferred on 3 L nasal cannula oxygen with nighttime BiPAP to the floor under my service on 12/03/2019.   Subjective:    Kelly Booker today denies any abdominal pain, nausea or vomiting, reports she had a good night sleep .   Assessment  & Plan :     Acute on chronic hypoxic and hypercapnic respiratory failure  -due to COPD exacerbation. With  underlying OSA and OHS with metabolic encephalopathy. -She was admitted to ICU and treated by pulmonary critical care with continuous BiPAP, IV steroids and supplemental oxygen, her mentation has improved, respiratory status has improved as well. -Currently off BiPAP, requiring CPAP at bedtime with 2 to 3 L oxygen connected to the CPAP, and requiring 2 to 3 L oxygen at rest and with activity. -Waiting her CPAP machine to be delivered from her home country [place] apparently currently is currently stuck in customs, case management is following he if they can arrange for CPAP at home. -Continue with a steroid  taper. - Continue steroid taper and supportive care with nebulizer treatments.  Added I-S and flutter valve.  Echo shows chronic diastolic dysfunction with preserved EF of 60%.  Advance activity.  PT OT evaluation. -He remains hypoxic with activity, and with minimal hypoxia at rest, as well at nighttime she did become hypoxic only on CPAP without oxygen, so I have discussed with son, she will need oxygen at rest, with activity and with CPAP. -CT chest was obtained, significant for mild linear scarring/atelectasis  of bilateral lung bases.  History of thromboembolic disease.   - On Xarelto.  Echo stable.  4.8 cm x 4.1 cm fat and soft tissue density mass within the left adrenal gland. 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 Correlation with adrenal protocol CT is recommended.,  This can be done as an outpatient  Morbid obesity.  BMI greater than 35.  Follow with PCP for weight loss.  DM type II.  Currently on sliding scale.  Stable, most likely upon discharge will require oral hypoglycemic.  Lab Results  Component Value Date   HGBA1C 7.0 (H) 12/01/2019   CBG (last 3)  Recent Labs    12/06/19 2125 12/07/19 0723 12/07/19 1213  GLUCAP 100* 99 97      Condition - Fair  Family Communication  : Discussed with son via phone 7/3  Code Status :  Full  Consults  :  PCCM  Procedures  :    TTE - 1. Left ventricular ejection fraction, by estimation, is 55 to 60%. The left ventricle has normal function. The left ventricle has no regional wall motion abnormalities. There is mild left ventricular hypertrophy. Left ventricular diastolic parameters are indeterminate.  2. Right ventricular systolic function is normal. The right ventricular size is normal.  3. The mitral valve is grossly normal. Trivial mitral valve regurgitation.  4. The aortic valve is tricuspid. Aortic valve regurgitation is not visualized. Mild aortic valve sclerosis is present, with no evidence of aortic valve  stenosis   PUD Prophylaxis : PPI  Disposition Plan  :    Status is: Inpatient  Remains inpatient appropriate because:IV treatments appropriate due to intensity of illness or inability to take PO   Dispo: The patient is from: Home              Anticipated d/c is to: Home              Anticipated d/c date is: 2 days              Patient currently is not medically stable to d/c.  DVT Prophylaxis  : Xarelto  Lab Results  Component Value Date   PLT 206 12/07/2019    Diet :  Diet Order            Diet heart healthy/carb modified Room service appropriate? No; Fluid consistency: Thin  Diet effective now                  Inpatient Medications Scheduled Meds: . chlorhexidine  15 mL Mouth Rinse BID  . Chlorhexidine Gluconate Cloth  6 each Topical Daily  . insulin aspart  0-20 Units Subcutaneous TID WC  . insulin aspart  0-5 Units Subcutaneous QHS  . ipratropium-albuterol  3 mL Nebulization BID  . mouth rinse  15 mL Mouth Rinse q12n4p  . pantoprazole  40 mg Oral Daily  . polyethylene glycol  17 g Oral Daily  . predniSONE  30 mg Oral Q breakfast  . rivaroxaban  20 mg Oral Q supper  . sodium chloride flush  3 mL Intravenous Q12H   Continuous Infusions:  PRN Meds:.albuterol, docusate sodium  Antibiotics  :   Anti-infectives (From admission, onward)   Start     Dose/Rate Route Frequency Ordered Stop   12/01/19 1600  azithromycin (ZITHROMAX) tablet 250 mg       "Followed by" Linked Group Details   250 mg Oral Daily 11/30/19 2355 12/04/19 1032   12/01/19 0830  cefTRIAXone (ROCEPHIN) 2  g in sodium chloride 0.9 % 100 mL IVPB  Status:  Discontinued        2 g 200 mL/hr over 30 Minutes Intravenous Every 24 hours 12/01/19 0735 12/02/19 0900   12/01/19 0000  azithromycin (ZITHROMAX) tablet 500 mg       "Followed by" Linked Group Details   500 mg Oral Daily 11/30/19 2355 12/01/19 0128          Objective:   Vitals:   12/07/19 0315 12/07/19 0728 12/07/19 0735 12/07/19  1200  BP: 118/70  121/60 118/66  Pulse: (!) 56 (!) 52 (!) 52 63  Resp: 15 16  (!) 21  Temp: 99 F (37.2 C)  98.3 F (36.8 C) 97.9 F (36.6 C)  TempSrc: Oral  Oral Oral  SpO2: 94% 93% 94% 97%  Weight: 79.6 kg     Height:        SpO2: 97 % O2 Flow Rate (L/min): 2 L/min FiO2 (%): 30 %  Wt Readings from Last 3 Encounters:  12/07/19 79.6 kg     Intake/Output Summary (Last 24 hours) at 12/07/2019 1507 Last data filed at 12/07/2019 0900 Gross per 24 hour  Intake 243 ml  Output --  Net 243 ml     Physical Exam  Awake Alert, Oriented X 3, No new F.N deficits, Normal affect Symmetrical Chest wall movement, Good air movement bilaterally, CTAB RRR,No Gallops,Rubs or new Murmurs, No Parasternal Heave +ve B.Sounds, Abd Soft, No tenderness, No rebound - guarding or rigidity. No Cyanosis, Clubbing or edema, No new Rash or bruise        Data Review:    Recent Labs  Lab 11/30/19 2040 12/01/19 0014 12/03/19 0414 12/04/19 0507 12/05/19 0056 12/06/19 0241 12/07/19 0253  WBC 7.6   < > 9.0 8.2 8.7 9.7 9.5  HGB 15.3*   < > 15.6* 15.2* 15.3* 15.3* 15.0  HCT 52.3*   < > 51.1* 50.0* 49.5* 49.6* 49.1*  PLT 200   < > 222 193 209 211 206  MCV 103.8*   < > 99.0 99.4 97.8 96.9 98.2  MCH 30.4   < > 30.2 30.2 30.2 29.9 30.0  MCHC 29.3*   < > 30.5 30.4 30.9 30.8 30.5  RDW 18.0*   < > 17.2* 17.0* 16.6* 16.5* 16.7*  LYMPHSABS 2.2  --   --  3.1 2.1 2.9 3.4  MONOABS 0.7  --   --  0.8 0.8 0.8 0.8  EOSABS 0.1  --   --  0.0 0.0 0.0 0.0  BASOSABS 0.0  --   --  0.0 0.0 0.0 0.0   < > = values in this interval not displayed.    Recent Labs  Lab 11/30/19 2040 12/01/19 0136 12/01/19 0815 12/01/19 0815 12/01/19 1143 12/02/19 0233 12/03/19 0414 12/03/19 0733 12/04/19 0507 12/05/19 0056 12/06/19 0241 12/07/19 0253  NA   < > 140 141   < >   < >  --  143  --  139 139 142 140  K   < > 5.2* 5.7*   < >   < >  --  4.1  --  4.3 4.5 4.5 4.4  CL   < > 94* 95*   < >  --   --  94*  --  94* 95* 99  99  CO2   < > 35* 36*   < >  --   --  39*  --  37* 35* 33* 31  GLUCOSE   < >  170* 172*   < >  --   --  103*  --  102* 89 95 97  BUN   < > 20 22   < >  --   --  41*  --  37* 38* 37* 33*  CREATININE   < > 1.02* 1.18*   < >  --   --  1.09*  --  1.10* 1.11* 1.13* 0.98  CALCIUM   < > 9.2 9.1   < >  --   --  8.9  --  9.2 9.1 9.1 9.1  AST  --   --   --   --   --   --   --   --  36 37 28 32  ALT  --   --   --   --   --   --   --   --  38 47* 46* 52*  ALKPHOS  --   --   --   --   --   --   --   --  60 53 58 56  BILITOT  --   --   --   --   --   --   --   --  0.5 0.8 0.9 0.8  ALBUMIN  --   --   --   --   --   --   --   --  3.2* 3.4* 3.4* 3.3*  MG  --   --   --   --   --   --   --   --  2.1 2.1 2.2 2.3  DDIMER  --  0.42  --   --   --   --   --   --   --   --   --   --   PROCALCITON  --   --  <0.10  --   --  <0.10  --   --   --   --   --   --   INR  --  1.6*  --   --   --   --   --   --   --   --   --   --   TSH  --  2.401  --   --   --   --   --   --   --   --   --   --   HGBA1C  --   --  7.0*  --   --   --   --   --   --   --   --   --   BNP   < >  --  56.7   < >  --   --   --  26.9 20.3 20.1 27.3 19.1   < > = values in this interval not displayed.    Recent Labs  Lab 11/30/19 2040 11/30/19 2314 12/01/19 0136 12/01/19 0815 12/01/19 0815 12/02/19 0233 12/03/19 9767 12/04/19 0507 12/05/19 0056 12/06/19 0241 12/07/19 0253  DDIMER  --   --  0.42  --   --   --   --   --   --   --   --   BNP   < >  --   --  56.7   < >  --  26.9 20.3 20.1 27.3 19.1  PROCALCITON  --   --   --  <0.10  --  <0.10  --   --   --   --   --  SARSCOV2NAA  --  NEGATIVE  --   --   --   --   --   --   --   --   --    < > = values in this interval not displayed.    ------------------------------------------------------------------------------------------------------------------ No results for input(s): CHOL, HDL, LDLCALC, TRIG, CHOLHDL, LDLDIRECT in the last 72 hours.  Lab Results  Component Value Date   HGBA1C 7.0  (H) 12/01/2019   ------------------------------------------------------------------------------------------------------------------ No results for input(s): TSH, T4TOTAL, T3FREE, THYROIDAB in the last 72 hours.  Invalid input(s): FREET3 ------------------------------------------------------------------------------------------------------------------ No results for input(s): VITAMINB12, FOLATE, FERRITIN, TIBC, IRON, RETICCTPCT in the last 72 hours.  Coagulation profile Recent Labs  Lab 12/01/19 0136  INR 1.6*    No results for input(s): DDIMER in the last 72 hours.  Cardiac Enzymes No results for input(s): CKMB, TROPONINI, MYOGLOBIN in the last 168 hours.  Invalid input(s): CK ------------------------------------------------------------------------------------------------------------------    Component Value Date/Time   BNP 19.1 12/07/2019 0253    Micro Results Recent Results (from the past 240 hour(s))  SARS Coronavirus 2 by RT PCR (hospital order, performed in [facility] hospital lab) Nasopharyngeal Nasopharyngeal Swab     Status: None   Collection Time: 11/30/19 11:14 PM   Specimen: Nasopharyngeal Swab  Result Value Ref Range Status   SARS Coronavirus 2 NEGATIVE NEGATIVE Final    Comment: (NOTE) SARS-CoV-2 target nucleic acids are NOT DETECTED.  The SARS-CoV-2 RNA is generally detectable in upper and lower respiratory specimens during the acute phase of infection. The lowest concentration of SARS-CoV-2 viral copies this assay can detect is 250 copies / mL. A negative result does not preclude SARS-CoV-2 infection and should not be used as the sole basis for treatment or other patient management decisions.  A negative result may occur with improper specimen collection / handling, submission of specimen other than nasopharyngeal swab, presence of viral mutation(s) within the areas targeted by this assay, and inadequate number of viral copies (<250 copies / mL). A  negative result must be combined with clinical observations, patient history, and epidemiological information.  Fact Sheet for Patients:   StrictlyIdeas.no  Fact Sheet for Healthcare Providers: BankingDealers.co.za  This test is not yet approved or  cleared by the [place] FDA and has been authorized for detection and/or diagnosis of SARS-CoV-2 by FDA under an Emergency Use Authorization (EUA).  This EUA will remain in effect (meaning this test can be used) for the duration of the COVID-19 declaration under Section 564(b)(1) of the Act, 21 U.S.C. section 360bbb-3(b)(1), unless the authorization is terminated or revoked sooner.  Performed at [facility], [place] [address], [place], [place] [address]   MRSA PCR Screening     Status: None   Collection Time: 12/01/19  8:01 AM   Specimen: Nasal Mucosa; Nasopharyngeal  Result Value Ref Range Status   MRSA by PCR NEGATIVE NEGATIVE Final    Comment:        The GeneXpert MRSA Assay (FDA approved for NASAL specimens only), is one component of a comprehensive MRSA colonization surveillance program. It is not intended to diagnose MRSA infection nor to guide or monitor treatment for MRSA infections. Performed at [facility], [place] [address] [place], [place] [address]   Respiratory Panel by PCR     Status: None   Collection Time: 12/01/19  9:13 AM  Result Value Ref Range Status   Adenovirus NOT DETECTED NOT DETECTED Final   Coronavirus 229E NOT DETECTED NOT DETECTED Final  Comment: (NOTE) The Coronavirus on the Respiratory Panel, DOES NOT test for the novel  Coronavirus (2019 nCoV)    Coronavirus HKU1 NOT DETECTED NOT DETECTED Final   Coronavirus NL63 NOT DETECTED NOT DETECTED Final   Coronavirus OC43 NOT DETECTED NOT DETECTED Final   Metapneumovirus NOT DETECTED NOT DETECTED Final   Rhinovirus / Enterovirus NOT DETECTED NOT DETECTED Final   Influenza A NOT DETECTED  NOT DETECTED Final   Influenza B NOT DETECTED NOT DETECTED Final   Parainfluenza Virus 1 NOT DETECTED NOT DETECTED Final   Parainfluenza Virus 2 NOT DETECTED NOT DETECTED Final   Parainfluenza Virus 3 NOT DETECTED NOT DETECTED Final   Parainfluenza Virus 4 NOT DETECTED NOT DETECTED Final   Respiratory Syncytial Virus NOT DETECTED NOT DETECTED Final   Bordetella pertussis NOT DETECTED NOT DETECTED Final   Chlamydophila pneumoniae NOT DETECTED NOT DETECTED Final   Mycoplasma pneumoniae NOT DETECTED NOT DETECTED Final    Comment: Performed at [facility], [place] [address] [place], [place] [address]    Radiology Reports DG Chest 2 View  Result Date: 12/01/2019 CLINICAL DATA:  Acute onset shortness of breath, COPD, hypoxia EXAM: CHEST - 2 VIEW COMPARISON:  11/30/2019 FINDINGS: Frontal and lateral views of the chest demonstrate a stable cardiac silhouette. Stable prominence of the central pulmonary vasculature without airspace disease, effusion, or pneumothorax. Lungs are mildly hyperinflated consistent with emphysema. No acute bony abnormalities. IMPRESSION: 1. Stable emphysema. 2. Stable vascular congestion without overt edema. Electronically Signed   By: Randa Ngo M.D.   On: 12/01/2019 00:30   DG Chest 2 View  Result Date: 11/30/2019 CLINICAL DATA:  On oxygen did in at home in [place]. She was not allowed to bring her oxygen tank to the [place]. EXAM: CHEST - 2 VIEW COMPARISON:  None. FINDINGS: Enlarged cardiac silhouette. Mildly prominent pulmonary vasculature and interstitial markings without Kerley lines. Small amount of linear atelectasis or scarring in the left lower lung zone and right lung base. The lungs are mildly hyperexpanded. No pleural fluid. Diffuse osteopenia. IMPRESSION: 1. Cardiomegaly and mild pulmonary vascular congestion. 2. Mild changes of COPD. Electronically Signed   By: Claudie Revering M.D.   On: 11/30/2019 19:14   CT CHEST WO CONTRAST  Result Date:  12/06/2019 CLINICAL DATA:  Hypoxia. EXAM: CT CHEST WITHOUT CONTRAST TECHNIQUE: Multidetector CT imaging of the chest was performed following the standard protocol without IV contrast. COMPARISON:  None. FINDINGS: Cardiovascular: There is mild calcification of the aortic arch. Normal heart size. No pericardial effusion. Mediastinum/Nodes: No enlarged mediastinal or axillary lymph nodes. Thyroid gland, trachea, and esophagus demonstrate no significant findings. Lungs/Pleura: Mild linear scarring and/or atelectasis is seen within the posterior aspect of the bilateral lung bases. There is no evidence of a pleural effusion or pneumothorax. Upper Abdomen: A 4.8 cm x 4.1 cm fat and soft tissue density mass is seen within the left adrenal gland. Musculoskeletal: No chest wall mass or suspicious bone lesions identified. IMPRESSION: 1. Mild linear scarring and/or atelectasis within the posterior aspect of the bilateral lung bases. 2. 4.8 cm x 4.1 cm fat and soft tissue density mass within the left adrenal gland. 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 Correlation with adrenal protocol CT is recommended. 3. Aortic atherosclerosis. Aortic Atherosclerosis (ICD10-I70.0). Electronically Signed   By: Virgina Norfolk M.D.   On: 12/06/2019 19:40   ECHOCARDIOGRAM COMPLETE  Result Date: 12/02/2019    ECHOCARDIOGRAM REPORT   Patient Name:   Hazely Grissom Date of Exam:  12/02/2019 Medical Rec #:  [id number]   Height: Accession #:    [id number]  Weight:       177.2 lb Date of Birth:  May 29, 1951    BSA:          1.863 m Patient Age:    60 years    BP:           92/53 mmHg Patient Gender: F           HR:           67 bpm. Exam Location:  Inpatient Procedure: 2D Echo, Cardiac Doppler, Color Doppler and Intracardiac            Opacification Agent Indications:    I50.9* Heart failure (unspecified)  History:        Patient has no prior history of Echocardiogram examinations.                 COPD; Signs/Symptoms:Dyspnea, Altered Mental Status  and                 Shortness of Breath. Hypoxia.  Sonographer:    Roseanna Rainbow RDCS Referring Phys: [id number] [facility] A THOMAS  Sonographer Comments: Technically difficult study due to poor echo windows. IMPRESSIONS  1. Left ventricular ejection fraction, by estimation, is 55 to 60%. The left ventricle has normal function. The left ventricle has no regional wall motion abnormalities. There is mild left ventricular hypertrophy. Left ventricular diastolic parameters are indeterminate.  2. Right ventricular systolic function is normal. The right ventricular size is normal.  3. The mitral valve is grossly normal. Trivial mitral valve regurgitation.  4. The aortic valve is tricuspid. Aortic valve regurgitation is not visualized. Mild aortic valve sclerosis is present, with no evidence of aortic valve stenosis. FINDINGS  Left Ventricle: Left ventricular ejection fraction, by estimation, is 55 to 60%. The left ventricle has normal function. The left ventricle has no regional wall motion abnormalities. Definity contrast agent was given IV to delineate the left ventricular  endocardial borders. The left ventricular internal cavity size was normal in size. There is mild left ventricular hypertrophy. Left ventricular diastolic parameters are indeterminate. Right Ventricle: The right ventricular size is normal. Right vetricular wall thickness was not assessed. Right ventricular systolic function is normal. Left Atrium: Left atrial size was normal in size. Right Atrium: Right atrial size was normal in size. Pericardium: Trivial pericardial effusion is present. Mitral Valve: The mitral valve is grossly normal. Trivial mitral valve regurgitation. Tricuspid Valve: The tricuspid valve is grossly normal. Tricuspid valve regurgitation is trivial. Aortic Valve: The aortic valve is tricuspid. Aortic valve regurgitation is not visualized. Mild aortic valve sclerosis is present, with no evidence of aortic valve stenosis. Pulmonic Valve: The  pulmonic valve was not well visualized. Pulmonic valve regurgitation is trivial. Aorta: The aortic root is normal in size and structure. IAS/Shunts: No atrial level shunt detected by color flow Doppler.  LEFT VENTRICLE PLAX 2D LVIDd:         4.40 cm      Diastology LVIDs:         3.20 cm      LV e' lateral:   6.04 cm/s LV PW:         1.20 cm      LV E/e' lateral: 17.7 LV IVS:        1.10 cm      LV e' medial:    4.95 cm/s LVOT diam:     1.90 cm  LV E/e' medial:  21.6 LV SV:         69 LV SV Index:   37 LVOT Area:     2.84 cm  LV Volumes (MOD) LV vol d, MOD A2C: 91.2 ml LV vol d, MOD A4C: 117.0 ml LV vol s, MOD A2C: 39.8 ml LV vol s, MOD A4C: 47.3 ml LV SV MOD A2C:     51.4 ml LV SV MOD A4C:     117.0 ml LV SV MOD BP:      63.6 ml RIGHT VENTRICLE            IVC RV S prime:     6.74 cm/s  IVC diam: 2.30 cm TAPSE (M-mode): 1.6 cm LEFT ATRIUM             Index       RIGHT ATRIUM           Index LA diam:        3.60 cm 1.93 cm/m  RA Area:     15.90 cm LA Vol (A2C):   36.8 ml 19.75 ml/m RA Volume:   40.70 ml  21.85 ml/m LA Vol (A4C):   42.4 ml 22.76 ml/m LA Biplane Vol: 42.2 ml 22.65 ml/m  AORTIC VALVE LVOT Vmax:   112.00 cm/s LVOT Vmean:  66.500 cm/s LVOT VTI:    0.242 m  AORTA Ao Root diam: 3.50 cm Ao Asc diam:  3.70 cm MITRAL VALVE MV Area (PHT): 3.99 cm     SHUNTS MV Decel Time: 190 msec     Systemic VTI:  0.24 m MV E velocity: 107.00 cm/s  Systemic Diam: 1.90 cm MV A velocity: 114.00 cm/s MV E/A ratio:  0.94 Dorris Carnes MD Electronically signed by Dorris Carnes MD Signature Date/Time: 12/02/2019/1:58:48 PM    Final       Phillips Climes M.D on 12/07/2019 at 3:07 PM  To page go to [email or web address] - password [facility]

## 2019-12-07 NOTE — Care Management (Addendum)
15:00 Spoke w son [name], [place] customs. CM will look into getting LOG to expedite DC.  15:30 Approval for LOG for CPAP obtained through [place], [facility] on call for the weekend. (Forms placed in [facility] office) Notified [employer], requested CPAP today for DC ASAP.  Dr [facility] notified. CM requested DME CPAP order with settings.   Waiting to hear back from [employer] if CPAP can be set up today.  15:50 Notified from [employer] that the soonest CPAP can be delivered is Tuesday. Dr [facility] notified.  Due to CO2 retention, patient will need to stay in the hospital until CPAP can be delivered.

## 2019-12-08 DIAGNOSIS — J9622 Acute and chronic respiratory failure with hypercapnia: Secondary | ICD-10-CM

## 2019-12-08 LAB — GLUCOSE, CAPILLARY
Glucose-Capillary: 94 mg/dL (ref 70–99)
Glucose-Capillary: 97 mg/dL (ref 70–99)
Glucose-Capillary: 216 mg/dL — ABNORMAL HIGH (ref 70–99)
Glucose-Capillary: 118 mg/dL — ABNORMAL HIGH (ref 70–99)

## 2019-12-08 MED ORDER — PREDNISONE 10 MG PO TABS
10.0000 mg | ORAL_TABLET | Freq: Every day | ORAL | Status: DC
  Administered 2019-12-09: 10 mg via ORAL
  Filled 2019-12-08: qty 1

## 2019-12-08 NOTE — Progress Notes (Signed)
Pt placed on V60 in CPAP mode for the night. Pt setting is 12 cmH2O and 30%. Pt respiratory status stable at this time.RT will continue to monitor.

## 2019-12-08 NOTE — Progress Notes (Signed)
PROGRESS NOTE                                                                                                                                                                                                             Patient Demographics:    Kelly Booker, is a 69 y.o. female, DOB - 12-20-1950, GBT:[id number]  Admit date - 11/30/2019   Admitting Physician Clance Boll, MD  Outpatient Primary MD for the patient is Patient, No Pcp Per  LOS - 8  Chief Complaint  Patient presents with   no oxygen tank       Brief Narrative  69 yo Spanish speaking female arrived from [place] 4 days prior to admission with hx of COPD and hypoxia presented to ER because she didn't have oxygen set up and had increased dyspnea.  Admitted by hospitalist with COPD exacerbation and acute on chronic hypoxic/hypercapnic respiratory failure.  Developed altered mental status 6/27 and transferred to ICU for Bipap therapy, he was stabilized and was transferred on 3 L nasal cannula oxygen with nighttime BiPAP to the floor under my service on 12/03/2019.   Subjective:    Kelly Booker today denies any abdominal pain, nausea or vomiting, reports she had a good night sleep .   Assessment  & Plan :     Acute on chronic hypoxic and hypercapnic respiratory failure  -due to COPD exacerbation. With  underlying OSA and OHS with metabolic encephalopathy. -She was admitted to ICU and treated by pulmonary critical care with continuous BiPAP, IV steroids and supplemental oxygen, her mentation has improved, respiratory status has improved as well. -Currently off BiPAP, requiring CPAP at bedtime with 2 to 3 L oxygen connected to the CPAP, and requiring 2 to 3 L oxygen at rest and with activity. -Given she did present with hypercapnic respiratory failure, while she was not using her CPAP, with PCO2 of 80 on admission, noncompliant with CPAP contributing to her Back apnea as well her COPD (COPD does not seem to be that  severe with current CT findings ), so she will need to have her CPAP machine at home on discharge otherwise she will have recurrent CO2 retention, discussed with home health, they are trying to arrange for CPAP loaner till the son can get the CPAP from [place](reports is currently stuck in the customs given it is medical equipment). -Continue with a steroid taper. - Continue steroid taper and supportive care with nebulizer treatments.  Added I-S and flutter valve.  Echo  shows chronic diastolic dysfunction with preserved EF of 60%.  Advance activity.  PT OT evaluation. -He remains hypoxic with activity, and with minimal hypoxia at rest, as well at nighttime she did become hypoxic only on CPAP without oxygen, so I have discussed with son, she will need oxygen at rest, with activity and with CPAP. -CT chest was obtained, significant for mild linear scarring/atelectasis of bilateral lung bases.  History of thromboembolic disease.   - On Xarelto.  Echo stable.  4.8 cm x 4.1 cm fat and soft tissue density mass within the left adrenal gland. 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 Correlation with adrenal protocol CT is recommended.,  This can be done as an outpatient  Morbid obesity.  BMI greater than 35.  Follow with PCP for weight loss.  DM type II.  Currently on sliding scale.  Stable, most likely upon discharge will require oral hypoglycemic.  Lab Results  Component Value Date   HGBA1C 7.0 (H) 12/01/2019   CBG (last 3)  Recent Labs    12/07/19 2130 12/08/19 0726 12/08/19 1205  GLUCAP 173* 94 97      Condition - Fair  Family Communication  : Discussed with son via phone 7/3  Code Status :  Full  Consults  :  PCCM  Procedures  :    TTE - 1. Left ventricular ejection fraction, by estimation, is 55 to 60%. The left ventricle has normal function. The left ventricle has no regional wall motion abnormalities. There is mild left ventricular hypertrophy. Left ventricular  diastolic parameters are indeterminate.  2. Right ventricular systolic function is normal. The right ventricular size is normal.  3. The mitral valve is grossly normal. Trivial mitral valve regurgitation.  4. The aortic valve is tricuspid. Aortic valve regurgitation is not visualized. Mild aortic valve sclerosis is present, with no evidence of aortic valve stenosis   PUD Prophylaxis : PPI  Disposition Plan  :    Status is: Inpatient  Remains inpatient appropriate because:IV treatments appropriate due to intensity of illness or inability to take PO   Dispo: The patient is from: Home              Anticipated d/c is to: Home              Anticipated d/c date is: 2 days              Patient currently is not medically stable to d/c.  DVT Prophylaxis  : Xarelto  Lab Results  Component Value Date   PLT 206 12/07/2019    Diet :  Diet Order            Diet heart healthy/carb modified Room service appropriate? No; Fluid consistency: Thin  Diet effective now                  Inpatient Medications Scheduled Meds:  chlorhexidine  15 mL Mouth Rinse BID   Chlorhexidine Gluconate Cloth  6 each Topical Daily   fluticasone furoate-vilanterol  1 puff Inhalation Daily   insulin aspart  0-20 Units Subcutaneous TID WC   insulin aspart  0-5 Units Subcutaneous QHS   mouth rinse  15 mL Mouth Rinse q12n4p   pantoprazole  40 mg Oral Daily   polyethylene glycol  17 g Oral Daily   predniSONE  20 mg Oral Q breakfast   rivaroxaban  20 mg Oral Q supper   sodium chloride flush  3 mL Intravenous Q12H   Continuous  Infusions:  PRN Meds:.docusate sodium, ipratropium-albuterol  Antibiotics  :   Anti-infectives (From admission, onward)   Start     Dose/Rate Route Frequency Ordered Stop   12/01/19 1600  azithromycin (ZITHROMAX) tablet 250 mg       "Followed by" Linked Group Details   250 mg Oral Daily 11/30/19 2355 12/04/19 1032   12/01/19 0830  cefTRIAXone (ROCEPHIN) 2 g in sodium  chloride 0.9 % 100 mL IVPB  Status:  Discontinued        2 g 200 mL/hr over 30 Minutes Intravenous Every 24 hours 12/01/19 0735 12/02/19 0900   12/01/19 0000  azithromycin (ZITHROMAX) tablet 500 mg       "Followed by" Linked Group Details   500 mg Oral Daily 11/30/19 2355 12/01/19 0128          Objective:   Vitals:   12/08/19 0305 12/08/19 0725 12/08/19 0948 12/08/19 1205  BP: 128/72 114/67  109/66  Pulse: (!) 52 (!) 54  (!) 55  Resp: 16 17  16   Temp: 97.8 F (36.6 C) 97.8 F (36.6 C)  (!) 97.4 F (36.3 C)  TempSrc: Axillary Oral  Oral  SpO2: 95% 100% 96% 96%  Weight: 72.9 kg     Height:        SpO2: 96 % O2 Flow Rate (L/min): 3 L/min FiO2 (%): 30 %  Wt Readings from Last 3 Encounters:  12/08/19 72.9 kg    No intake or output data in the 24 hours ending 12/08/19 1415   Physical Exam  Awake Alert, Oriented X 3, No new F.N deficits, Normal affect Symmetrical Chest wall movement, Good air movement bilaterally, CTAB RRR,No Gallops,Rubs or new Murmurs, No Parasternal Heave +ve B.Sounds, Abd Soft, No tenderness, No rebound - guarding or rigidity. No Cyanosis, Clubbing or edema, No new Rash or bruise     Tele interpreter was used during evaluation   Data Review:    Recent Labs  Lab 12/03/19 0414 12/04/19 0507 12/05/19 0056 12/06/19 0241 12/07/19 0253  WBC 9.0 8.2 8.7 9.7 9.5  HGB 15.6* 15.2* 15.3* 15.3* 15.0  HCT 51.1* 50.0* 49.5* 49.6* 49.1*  PLT 222 193 209 211 206  MCV 99.0 99.4 97.8 96.9 98.2  MCH 30.2 30.2 30.2 29.9 30.0  MCHC 30.5 30.4 30.9 30.8 30.5  RDW 17.2* 17.0* 16.6* 16.5* 16.7*  LYMPHSABS  --  3.1 2.1 2.9 3.4  MONOABS  --  0.8 0.8 0.8 0.8  EOSABS  --  0.0 0.0 0.0 0.0  BASOSABS  --  0.0 0.0 0.0 0.0    Recent Labs  Lab 12/02/19 0233 12/03/19 0414 12/03/19 0733 12/04/19 0507 12/05/19 0056 12/06/19 0241 12/07/19 0253  NA  --  143  --  139 139 142 140  K  --  4.1  --  4.3 4.5 4.5 4.4  CL  --  94*  --  94* 95* 99 99  CO2  --  39*   --  37* 35* 33* 31  GLUCOSE  --  103*  --  102* 89 95 97  BUN  --  41*  --  37* 38* 37* 33*  CREATININE  --  1.09*  --  1.10* 1.11* 1.13* 0.98  CALCIUM  --  8.9  --  9.2 9.1 9.1 9.1  AST  --   --   --  36 37 28 32  ALT  --   --   --  38 47* 46* 52*  ALKPHOS  --   --   --  60 53 58 56  BILITOT  --   --   --  0.5 0.8 0.9 0.8  ALBUMIN  --   --   --  3.2* 3.4* 3.4* 3.3*  MG  --   --   --  2.1 2.1 2.2 2.3  PROCALCITON <0.10  --   --   --   --   --   --   BNP  --   --  26.9 20.3 20.1 27.3 19.1    Recent Labs  Lab 12/02/19 0233 12/03/19 0733 12/04/19 0507 12/05/19 0056 12/06/19 0241 12/07/19 0253  BNP  --  26.9 20.3 20.1 27.3 19.1  PROCALCITON <0.10  --   --   --   --   --     ------------------------------------------------------------------------------------------------------------------ No results for input(s): CHOL, HDL, LDLCALC, TRIG, CHOLHDL, LDLDIRECT in the last 72 hours.  Lab Results  Component Value Date   HGBA1C 7.0 (H) 12/01/2019   ------------------------------------------------------------------------------------------------------------------ No results for input(s): TSH, T4TOTAL, T3FREE, THYROIDAB in the last 72 hours.  Invalid input(s): FREET3 ------------------------------------------------------------------------------------------------------------------ No results for input(s): VITAMINB12, FOLATE, FERRITIN, TIBC, IRON, RETICCTPCT in the last 72 hours.  Coagulation profile No results for input(s): INR, PROTIME in the last 168 hours.  No results for input(s): DDIMER in the last 72 hours.  Cardiac Enzymes No results for input(s): CKMB, TROPONINI, MYOGLOBIN in the last 168 hours.  Invalid input(s): CK ------------------------------------------------------------------------------------------------------------------    Component Value Date/Time   BNP 19.1 12/07/2019 0253    Micro Results Recent Results (from the past 240 hour(s))  SARS Coronavirus 2 by  RT PCR (hospital order, performed in [facility] hospital lab) Nasopharyngeal Nasopharyngeal Swab     Status: None   Collection Time: 11/30/19 11:14 PM   Specimen: Nasopharyngeal Swab  Result Value Ref Range Status   SARS Coronavirus 2 NEGATIVE NEGATIVE Final    Comment: (NOTE) SARS-CoV-2 target nucleic acids are NOT DETECTED.  The SARS-CoV-2 RNA is generally detectable in upper and lower respiratory specimens during the acute phase of infection. The lowest concentration of SARS-CoV-2 viral copies this assay can detect is 250 copies / mL. A negative result does not preclude SARS-CoV-2 infection and should not be used as the sole basis for treatment or other patient management decisions.  A negative result may occur with improper specimen collection / handling, submission of specimen other than nasopharyngeal swab, presence of viral mutation(s) within the areas targeted by this assay, and inadequate number of viral copies (<250 copies / mL). A negative result must be combined with clinical observations, patient history, and epidemiological information.  Fact Sheet for Patients:   StrictlyIdeas.no  Fact Sheet for Healthcare Providers: BankingDealers.co.za  This test is not yet approved or  cleared by the [place] FDA and has been authorized for detection and/or diagnosis of SARS-CoV-2 by FDA under an Emergency Use Authorization (EUA).  This EUA will remain in effect (meaning this test can be used) for the duration of the COVID-19 declaration under Section 564(b)(1) of the Act, 21 U.S.C. section 360bbb-3(b)(1), unless the authorization is terminated or revoked sooner.  Performed at [facility], [place] [address], [place], [place] [address]   MRSA PCR Screening     Status: None   Collection Time: 12/01/19  8:01 AM   Specimen: Nasal Mucosa; Nasopharyngeal  Result Value Ref Range Status   MRSA by PCR NEGATIVE NEGATIVE Final     Comment:        The GeneXpert MRSA Assay (FDA approved  for NASAL specimens only), is one component of a comprehensive MRSA colonization surveillance program. It is not intended to diagnose MRSA infection nor to guide or monitor treatment for MRSA infections. Performed at [facility], [place] [address] [place], [place] [address]   Respiratory Panel by PCR     Status: None   Collection Time: 12/01/19  9:13 AM  Result Value Ref Range Status   Adenovirus NOT DETECTED NOT DETECTED Final   Coronavirus 229E NOT DETECTED NOT DETECTED Final    Comment: (NOTE) The Coronavirus on the Respiratory Panel, DOES NOT test for the novel  Coronavirus (2019 nCoV)    Coronavirus HKU1 NOT DETECTED NOT DETECTED Final   Coronavirus NL63 NOT DETECTED NOT DETECTED Final   Coronavirus OC43 NOT DETECTED NOT DETECTED Final   Metapneumovirus NOT DETECTED NOT DETECTED Final   Rhinovirus / Enterovirus NOT DETECTED NOT DETECTED Final   Influenza A NOT DETECTED NOT DETECTED Final   Influenza B NOT DETECTED NOT DETECTED Final   Parainfluenza Virus 1 NOT DETECTED NOT DETECTED Final   Parainfluenza Virus 2 NOT DETECTED NOT DETECTED Final   Parainfluenza Virus 3 NOT DETECTED NOT DETECTED Final   Parainfluenza Virus 4 NOT DETECTED NOT DETECTED Final   Respiratory Syncytial Virus NOT DETECTED NOT DETECTED Final   Bordetella pertussis NOT DETECTED NOT DETECTED Final   Chlamydophila pneumoniae NOT DETECTED NOT DETECTED Final   Mycoplasma pneumoniae NOT DETECTED NOT DETECTED Final    Comment: Performed at [facility], [place]. [address] [place],  [address]    Radiology Reports DG Chest 2 View  Result Date: 12/01/2019 CLINICAL DATA:  Acute onset shortness of breath, COPD, hypoxia EXAM: CHEST - 2 VIEW COMPARISON:  11/30/2019 FINDINGS: Frontal and lateral views of the chest demonstrate a stable cardiac silhouette. Stable prominence of the central pulmonary vasculature without airspace disease, effusion,  or pneumothorax. Lungs are mildly hyperinflated consistent with emphysema. No acute bony abnormalities. IMPRESSION: 1. Stable emphysema. 2. Stable vascular congestion without overt edema. Electronically Signed   By: Randa Ngo M.D.   On: 12/01/2019 00:30   DG Chest 2 View  Result Date: 11/30/2019 CLINICAL DATA:  On oxygen did in at home in [place]. She was not allowed to bring her oxygen tank to the [place]. EXAM: CHEST - 2 VIEW COMPARISON:  None. FINDINGS: Enlarged cardiac silhouette. Mildly prominent pulmonary vasculature and interstitial markings without Kerley lines. Small amount of linear atelectasis or scarring in the left lower lung zone and right lung base. The lungs are mildly hyperexpanded. No pleural fluid. Diffuse osteopenia. IMPRESSION: 1. Cardiomegaly and mild pulmonary vascular congestion. 2. Mild changes of COPD. Electronically Signed   By: Claudie Revering M.D.   On: 11/30/2019 19:14   CT CHEST WO CONTRAST  Result Date: 12/06/2019 CLINICAL DATA:  Hypoxia. EXAM: CT CHEST WITHOUT CONTRAST TECHNIQUE: Multidetector CT imaging of the chest was performed following the standard protocol without IV contrast. COMPARISON:  None. FINDINGS: Cardiovascular: There is mild calcification of the aortic arch. Normal heart size. No pericardial effusion. Mediastinum/Nodes: No enlarged mediastinal or axillary lymph nodes. Thyroid gland, trachea, and esophagus demonstrate no significant findings. Lungs/Pleura: Mild linear scarring and/or atelectasis is seen within the posterior aspect of the bilateral lung bases. There is no evidence of a pleural effusion or pneumothorax. Upper Abdomen: A 4.8 cm x 4.1 cm fat and soft tissue density mass is seen within the left adrenal gland. Musculoskeletal: No chest wall mass or suspicious bone lesions identified. IMPRESSION:  1. Mild linear scarring and/or atelectasis within the posterior aspect of the bilateral lung bases. 2. 4.8 cm x 4.1 cm fat and soft tissue density  mass within the left adrenal gland. 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 Correlation with adrenal protocol CT is recommended. 3. Aortic atherosclerosis. Aortic Atherosclerosis (ICD10-I70.0). Electronically Signed   By: Virgina Norfolk M.D.   On: 12/06/2019 19:40   ECHOCARDIOGRAM COMPLETE  Result Date: 12/02/2019    ECHOCARDIOGRAM REPORT   Patient Name:   BROGAN ENGLAND Date of Exam: 12/02/2019 Medical Rec #:  [id number]   Height: Accession #:    [id number]  Weight:       177.2 lb Date of Birth:  01-Nov-1950    BSA:          1.863 m Patient Age:    8 years    BP:           92/53 mmHg Patient Gender: F           HR:           67 bpm. Exam Location:  Inpatient Procedure: 2D Echo, Cardiac Doppler, Color Doppler and Intracardiac            Opacification Agent Indications:    I50.9* Heart failure (unspecified)  History:        Patient has no prior history of Echocardiogram examinations.                 COPD; Signs/Symptoms:Dyspnea, Altered Mental Status and                 Shortness of Breath. Hypoxia.  Sonographer:    Roseanna Rainbow RDCS Referring Phys: [id number] [facility] A THOMAS  Sonographer Comments: Technically difficult study due to poor echo windows. IMPRESSIONS  1. Left ventricular ejection fraction, by estimation, is 55 to 60%. The left ventricle has normal function. The left ventricle has no regional wall motion abnormalities. There is mild left ventricular hypertrophy. Left ventricular diastolic parameters are indeterminate.  2. Right ventricular systolic function is normal. The right ventricular size is normal.  3. The mitral valve is grossly normal. Trivial mitral valve regurgitation.  4. The aortic valve is tricuspid. Aortic valve regurgitation is not visualized. Mild aortic valve sclerosis is present, with no evidence of aortic valve stenosis. FINDINGS  Left Ventricle: Left ventricular ejection fraction, by estimation, is 55 to 60%. The left ventricle has normal function. The left ventricle has no regional  wall motion abnormalities. Definity contrast agent was given IV to delineate the left ventricular  endocardial borders. The left ventricular internal cavity size was normal in size. There is mild left ventricular hypertrophy. Left ventricular diastolic parameters are indeterminate. Right Ventricle: The right ventricular size is normal. Right vetricular wall thickness was not assessed. Right ventricular systolic function is normal. Left Atrium: Left atrial size was normal in size. Right Atrium: Right atrial size was normal in size. Pericardium: Trivial pericardial effusion is present. Mitral Valve: The mitral valve is grossly normal. Trivial mitral valve regurgitation. Tricuspid Valve: The tricuspid valve is grossly normal. Tricuspid valve regurgitation is trivial. Aortic Valve: The aortic valve is tricuspid. Aortic valve regurgitation is not visualized. Mild aortic valve sclerosis is present, with no evidence of aortic valve stenosis. Pulmonic Valve: The pulmonic valve was not well visualized. Pulmonic valve regurgitation is trivial. Aorta: The aortic root is normal in size and structure. IAS/Shunts: No atrial level shunt detected by color flow Doppler.  LEFT VENTRICLE PLAX 2D LVIDd:  4.40 cm      Diastology LVIDs:         3.20 cm      LV e' lateral:   6.04 cm/s LV PW:         1.20 cm      LV E/e' lateral: 17.7 LV IVS:        1.10 cm      LV e' medial:    4.95 cm/s LVOT diam:     1.90 cm      LV E/e' medial:  21.6 LV SV:         69 LV SV Index:   37 LVOT Area:     2.84 cm  LV Volumes (MOD) LV vol d, MOD A2C: 91.2 ml LV vol d, MOD A4C: 117.0 ml LV vol s, MOD A2C: 39.8 ml LV vol s, MOD A4C: 47.3 ml LV SV MOD A2C:     51.4 ml LV SV MOD A4C:     117.0 ml LV SV MOD BP:      63.6 ml RIGHT VENTRICLE            IVC RV S prime:     6.74 cm/s  IVC diam: 2.30 cm TAPSE (M-mode): 1.6 cm LEFT ATRIUM             Index       RIGHT ATRIUM           Index LA diam:        3.60 cm 1.93 cm/m  RA Area:     15.90 cm LA Vol  (A2C):   36.8 ml 19.75 ml/m RA Volume:   40.70 ml  21.85 ml/m LA Vol (A4C):   42.4 ml 22.76 ml/m LA Biplane Vol: 42.2 ml 22.65 ml/m  AORTIC VALVE LVOT Vmax:   112.00 cm/s LVOT Vmean:  66.500 cm/s LVOT VTI:    0.242 m  AORTA Ao Root diam: 3.50 cm Ao Asc diam:  3.70 cm MITRAL VALVE MV Area (PHT): 3.99 cm     SHUNTS MV Decel Time: 190 msec     Systemic VTI:  0.24 m MV E velocity: 107.00 cm/s  Systemic Diam: 1.90 cm MV A velocity: 114.00 cm/s MV E/A ratio:  0.94 Dorris Carnes MD Electronically signed by Dorris Carnes MD Signature Date/Time: 12/02/2019/1:58:48 PM    Final       Phillips Climes M.D on 12/08/2019 at 2:15 PM  To page go to [email or web address] - password [facility]

## 2019-12-09 LAB — GLUCOSE, CAPILLARY
Glucose-Capillary: 92 mg/dL (ref 70–99)
Glucose-Capillary: 104 mg/dL — ABNORMAL HIGH (ref 70–99)
Glucose-Capillary: 131 mg/dL — ABNORMAL HIGH (ref 70–99)
Glucose-Capillary: 211 mg/dL — ABNORMAL HIGH (ref 70–99)

## 2019-12-09 MED ORDER — PREDNISONE 5 MG PO TABS
5.0000 mg | ORAL_TABLET | Freq: Every day | ORAL | Status: DC
  Administered 2019-12-10: 5 mg via ORAL
  Filled 2019-12-09: qty 1

## 2019-12-09 NOTE — Progress Notes (Signed)
Placed patient on CPAP , via FFM 12.0 cm H20 (previous settings) @ 30% FIO2. Tolerating well at this time.

## 2019-12-09 NOTE — Progress Notes (Signed)
Physical Therapy Treatment and Discharge Patient Details Name: Kelly Booker MRN: [id number] DOB: 18-Oct-1950 Today's Date: 12/09/2019    History of Present Illness 69 yo Spanish speaking female arrived from [place] 4 days prior to admission with hx of COPD and hypoxia presented to ER because she didn't have oxygen set up and had increased dyspnea.  Admitted by hospitalist with COPD exacerbation and acute on chronic hypoxic/hypercapnic respiratory failure.  Developed altered mental status 6/27 and transferred to ICU for Bipap therapy.    PT Comments    Patient has met all PT goals. No further PT needs identified. Pt continues to require O2 to maintain oxygen saturation while mobilizing.     Follow Up Recommendations  No PT follow up     Equipment Recommendations  None recommended by PT    Recommendations for Other Services       Precautions / Restrictions Precautions Precautions: Fall Precaution Comments: watch O2 Restrictions Weight Bearing Restrictions: No    Mobility  Bed Mobility               General bed mobility comments: pt sitting in recliner upon arrival  Transfers Overall transfer level: Independent Equipment used: None Transfers: Sit to/from Stand Sit to Stand: Independent         General transfer comment: assist for lines & O2  Ambulation/Gait Ambulation/Gait assistance: Modified independent (Device/Increase time) Gait Distance (Feet): 250 Feet Assistive device: None Gait Pattern/deviations: Step-through pattern;WFL(Within Functional Limits) Gait velocity: WNL   General Gait Details: sats on 4L >90% while walking   Stairs Stairs: Yes Stairs assistance: Modified independent (Device/Increase time) Stair Management: One rail Right;Alternating pattern;Step to pattern;Forwards Number of Stairs: 10 General stair comments: step to pattern to descend, step through to ascend, carried O2 for her, pt steady on steps with one rail   Wheelchair  Mobility    Modified Rankin (Stroke Patients Only)       Balance Overall balance assessment: No apparent balance deficits (not formally assessed)                                          Cognition Arousal/Alertness: Awake/alert Behavior During Therapy: WFL for tasks assessed/performed Overall Cognitive Status: Within Functional Limits for tasks assessed                                 General Comments: Cueing to move more slowly.  Educated on importance of maintaining SpO2 >90 and ways to compensate with energy conservation      Exercises      General Comments General comments (skin integrity, edema, etc.): Via virtual interpreter ([employer]), pt educated to continnue walking once discharged; avoiding extreme heat      Pertinent Vitals/Pain Pain Assessment: No/denies pain    Home Living                      Prior Function            PT Goals (current goals can now be found in the care plan section) Acute Rehab PT Goals Patient Stated Goal: to get stronger  PT Goal Formulation: With patient Time For Goal Achievement: 12/09/19 Potential to Achieve Goals: Good Progress towards PT goals: Goals met/education completed, patient discharged from PT    Frequency    Min 3X/week  PT Plan Current plan remains appropriate    Co-evaluation              AM-PAC PT "6 Clicks" Mobility   Outcome Measure  Help needed turning from your back to your side while in a flat bed without using bedrails?: None Help needed moving from lying on your back to sitting on the side of a flat bed without using bedrails?: None Help needed moving to and from a bed to a chair (including a wheelchair)?: None Help needed standing up from a chair using your arms (e.g., wheelchair or bedside chair)?: None Help needed to walk in hospital room?: None Help needed climbing 3-5 steps with a railing? : None 6 Click Score: 24    End of Session  Equipment Utilized During Treatment: Oxygen Activity Tolerance: Patient tolerated treatment well Patient left: in chair;with call bell/phone within reach Nurse Communication: Mobility status PT Visit Diagnosis: Other abnormalities of gait and mobility (R26.89);Difficulty in walking, not elsewhere classified (R26.2)     Time: 2549-8264 PT Time Calculation (min) (ACUTE ONLY): 19 min  Charges:  $Gait Training: 8-22 mins                      Arby Barrette, PT Pager [phone]    Rexanne Mano 12/09/2019, 3:17 PM

## 2019-12-09 NOTE — Plan of Care (Signed)
?  Problem: Clinical Measurements: ?Goal: Respiratory complications will improve ?Outcome: Progressing ?  ?Problem: Activity: ?Goal: Risk for activity intolerance will decrease ?Outcome: Progressing ?  ?Problem: Coping: ?Goal: Level of anxiety will decrease ?Outcome: Progressing ?  ?Problem: Elimination: ?Goal: Will not experience complications related to urinary retention ?Outcome: Progressing ?  ?

## 2019-12-09 NOTE — Progress Notes (Signed)
Occupational Therapy Treatment Patient Details Name: Kelly Booker MRN: [id number] DOB: 04/21/1951 Today's Date: 12/09/2019    History of present illness 69 yo Spanish speaking female arrived from [place] 4 days prior to admission with hx of COPD and hypoxia presented to ER because she didn't have oxygen set up and had increased dyspnea.  Admitted by hospitalist with COPD exacerbation and acute on chronic hypoxic/hypercapnic respiratory failure.  Developed altered mental status 6/27 and transferred to ICU for Bipap therapy.   OT comments  Patient up in chair on arrival.  She stated was woke up feeling very happy today.  Continuing to work on energy conservation methods this session as well as patient education on importance of oxygen use with mobility.  Implemented techniques during standing grooming at sink.  Able to complete with supervision.  Patient on 4L O2 throughout session and SpO2 remained >95, showing improvement.  Will continue to follow for further energy conservation and activity tolerance work.    Follow Up Recommendations  No OT follow up;Supervision - Intermittent    Equipment Recommendations  3 in 1 bedside commode    Recommendations for Other Services      Precautions / Restrictions Precautions Precautions: Fall Precaution Comments: watch O2 Restrictions Weight Bearing Restrictions: No       Mobility Bed Mobility               General bed mobility comments: pt sitting in recliner upon arrival  Transfers Overall transfer level: Needs assistance Equipment used: None Transfers: Sit to/from Stand Sit to Stand: Supervision         General transfer comment: assist for lines & O2    Balance Overall balance assessment: No apparent balance deficits (not formally assessed)                                         ADL either performed or assessed with clinical judgement   ADL Overall ADL's : Needs assistance/impaired     Grooming:  Wash/dry hands;Wash/dry face;Oral care;Brushing hair;Supervision/safety;Standing               Lower Body Dressing: Min guard;Sit to/from stand;Maximal assistance Lower Body Dressing Details (indicate cue type and reason): Max assist with donnin gsocks- states this is her baseline                     Vision       Perception     Praxis      Cognition Arousal/Alertness: Awake/alert Behavior During Therapy: WFL for tasks assessed/performed Overall Cognitive Status: Within Functional Limits for tasks assessed                                 General Comments: Cueing to move more slowly.  Educated on importance of maintaining SpO2 >90 and ways to compensate with energy conservation        Exercises     Shoulder Instructions       General Comments Interpreter used during session: Belenda Cruise #[id number]    Pertinent Vitals/ Pain       Pain Assessment: No/denies pain  Home Living  Prior Functioning/Environment              Frequency  Min 2X/week        Progress Toward Goals  OT Goals(current goals can now be found in the care plan section)  Progress towards OT goals: Progressing toward goals  Acute Rehab OT Goals Patient Stated Goal: to get stronger  OT Goal Formulation: With patient/family Time For Goal Achievement: 12/16/19 Potential to Achieve Goals: Good  Plan Discharge plan remains appropriate    Co-evaluation                 AM-PAC OT "6 Clicks" Daily Activity     Outcome Measure   Help from another person eating meals?: None Help from another person taking care of personal grooming?: A Little Help from another person toileting, which includes using toliet, bedpan, or urinal?: A Little Help from another person bathing (including washing, rinsing, drying)?: A Little Help from another person to put on and taking off regular upper body clothing?: None Help from  another person to put on and taking off regular lower body clothing?: A Little 6 Click Score: 20    End of Session Equipment Utilized During Treatment: Oxygen  OT Visit Diagnosis: Other abnormalities of gait and mobility (R26.89)   Activity Tolerance Patient tolerated treatment well   Patient Left in chair;with call bell/phone within reach   Nurse Communication Mobility status        Time: 3967-2897 OT Time Calculation (min): 18 min  Charges: OT General Charges $OT Visit: 1 Visit OT Treatments $Self Care/Home Management : 8-22 mins  August Luz, OTR/L    Phylliss Bob 12/09/2019, 12:49 PM

## 2019-12-09 NOTE — Discharge Instructions (Signed)
Informacin sobre mi medicina - XARELTO (rivaroxaban)  POR QU SE LE RECET XARELTO? Xarelto se le recet para tratar los cogulos de sangre que pueden haber sido encontrados en las venas de las piernas (trombosis venosa profunda) o en los pulmones (embolia pulmonar) y para reducir el riesgo de que se vuelvan a producir.   QU NECESITA SABER SOBRE XARELTO?  Tomar 20 mg al da con la cena.  NO deje de tomar Xarelto sin hablar con el proveedor de atencin Public librarian. Vuelva a surtir su receta de pastillas de 20 mg antes de que se le acaben.  Despus de darle de alta, debe tener citas de control regulares con el proveedor de atencin mdica que le est recetando su Xarelto. En el futuro su dosis puede ser cambiada si hay cambios en su funcin renal en una cantidad significativa.  QU HACER SI SE LE OLVIDA TOMAR UNA DOSIS?  Si usted est tomando [employer] AL DA y Charissa Bash dosis, tmela tan pronto lo recuerde. Puede tomar [employer] de 15 mg (un total de 30 mg) al mismo tiempo, luego al da siguiente contine con su horario regular de 15 mg [employer].   Si usted est tomando Xarelto UNA VEZ AL DA y Charissa Bash dosis, tmela tan pronto lo recuerde en el mismo da, luego al da siguiente contine con su horario regular de Conservator, museum/gallery. No tome dos dosis deXarelto al [employer].  INFORMACIN IMPORTANTE DE SEGURIDAD  Xarelto es un medicamento anticoagulante que puede causar sangrado. Debe llamar a su proveedor de atencin mdica de inmediato si usted experimenta cualquiera de los siguientes:  ?   Sangrado de una lesin o de la nariz que no cesa de parar. ?   Color extrao de la orina (rojo o marr?n oscuro) o de las heces fecales (rojo o negro). ?   Moretones raros por [employer]. ?   Una cada grave o si se golpea la cabeza (incluso si no hay sangrado).  Algunos medicamentos pueden interactuar con Xarelto y podran aumentar su  riesgo de sangrado mientras que est tomando Xarelto. Para ayudar a evitar esto, consulte con su proveedor de atencin mdica o con su farmacutico antes de usar cualquier medicamento nuevo con receta o sin receta, incluyendo hierbas, vitaminas, medicamentos antiinflamatorios sin esteroides (AINEs) y suplementos.   Esta educacin [employer] se revis conmigo o con mi representante de atencin mdica como parte de mi preparacin al darme de alta. Esta pgina web tiene ms informacin sobre Xarelto: [email or web address]

## 2019-12-09 NOTE — Progress Notes (Signed)
RN placed pt on CPAP V60  For the night. Pt respiratory stable at this time. RT will continue to monitor.

## 2019-12-09 NOTE — Progress Notes (Signed)
PROGRESS NOTE                                                                                                                                                                                                             Patient Demographics:    Kelly Booker, is a 69 y.o. female, DOB - [birth year], OIZ:[id number]  Admit date - 11/30/2019   Admitting Physician Clance Boll, MD  Outpatient Primary MD for the patient is Patient, No Pcp Per  LOS - 9  Chief Complaint  Patient presents with  . no oxygen tank       Brief Narrative  69 yo Spanish speaking female arrived from [place] 4 days prior to admission with hx of COPD and hypoxia presented to ER because she didn't have oxygen set up and had increased dyspnea.  Admitted by hospitalist with COPD exacerbation and acute on chronic hypoxic/hypercapnic respiratory failure.  Developed altered mental status 6/27 and transferred to ICU for Bipap therapy, he was stabilized and was transferred on 3 L nasal cannula oxygen with nighttime BiPAP to the floor on 12/03/2019.   Subjective:    Kelly Booker today no nausea, no vomiting, no dyspnea, tolerating nighttime sleep with CPAP.   Assessment  & Plan :     Acute on chronic hypoxic and hypercapnic respiratory failure  -due to COPD exacerbation. With  underlying OSA and OHS with metabolic encephalopathy. -She was admitted to ICU and treated by pulmonary critical care with continuous BiPAP, IV steroids and supplemental oxygen, her mentation has improved, respiratory status has improved as well. -Currently off BiPAP, requiring CPAP at bedtime with 2 to 3 L oxygen connected to the CPAP, and requiring 2 to 3 L oxygen at rest and with activity. -Given she did present with hypercapnic respiratory failure, while she was not using her CPAP, with PCO2 of 80 on admission, noncompliant with CPAP contributing to her Back apnea as well her COPD (COPD does not seem to be that severe with current CT  findings ), so she will need to have her CPAP machine at home on discharge otherwise she will have recurrent CO2 retention, discussed with home health, they are trying to arrange for CPAP loaner till the son can get the CPAP from [place](reports is currently stuck in the customs given it is medical equipment). -Continue with a steroid taper. - Continue steroid taper and supportive care with nebulizer treatments.  Added I-S and flutter valve.  Echo shows chronic diastolic dysfunction with preserved EF  of 60%.  Advance activity.  PT OT evaluation. -He remains hypoxic with activity, and with minimal hypoxia at rest, as well at nighttime she did become hypoxic only on CPAP without oxygen, so I have discussed with son, she will need oxygen at rest, with activity and with CPAP. -CT chest was obtained, significant for mild linear scarring/atelectasis of bilateral lung bases.  History of thromboembolic disease.   - On Xarelto.  Echo stable.  4.8 cm x 4.1 cm fat and soft tissue density mass within the left adrenal gland. 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 Correlation with adrenal protocol CT is recommended.,  This can be done as an outpatient  Morbid obesity.  BMI greater than 35.  Follow with PCP for weight loss.  DM type II.  Currently on sliding scale.  Stable, most likely upon discharge will require oral hypoglycemic.  Lab Results  Component Value Date   HGBA1C 7.0 (H) 12/01/2019   CBG (last 3)  Recent Labs    12/08/19 2153 12/09/19 0731 12/09/19 1142  GLUCAP 118* 20 104*      Condition - Fair  Family Communication  : Discussed with son via phone 7/3  Code Status :  Full  Consults  :  PCCM  Procedures  :    TTE - 1. Left ventricular ejection fraction, by estimation, is 55 to 60%. The left ventricle has normal function. The left ventricle has no regional wall motion abnormalities. There is mild left ventricular hypertrophy. Left ventricular diastolic parameters are  indeterminate.  2. Right ventricular systolic function is normal. The right ventricular size is normal.  3. The mitral valve is grossly normal. Trivial mitral valve regurgitation.  4. The aortic valve is tricuspid. Aortic valve regurgitation is not visualized. Mild aortic valve sclerosis is present, with no evidence of aortic valve stenosis   PUD Prophylaxis : PPI  Disposition Plan  :    Status is: Inpatient  Remains inpatient appropriate because:IV treatments appropriate due to intensity of illness or inability to take PO   Dispo: The patient is from: Home              Anticipated d/c is to: Home              Anticipated d/c date is: 1 day              Patient currently is medically stable to d/c.  She can be discharged home once CPAP bedtime has been arranged (either her CPAP released by customs, or it will be arranged by home health, hopefully by tomorrow).  DVT Prophylaxis  : Xarelto  Lab Results  Component Value Date   PLT 206 12/07/2019    Diet :  Diet Order            Diet heart healthy/carb modified Room service appropriate? No; Fluid consistency: Thin  Diet effective now                  Inpatient Medications Scheduled Meds: . chlorhexidine  15 mL Mouth Rinse BID  . Chlorhexidine Gluconate Cloth  6 each Topical Daily  . fluticasone furoate-vilanterol  1 puff Inhalation Daily  . insulin aspart  0-20 Units Subcutaneous TID WC  . insulin aspart  0-5 Units Subcutaneous QHS  . mouth rinse  15 mL Mouth Rinse q12n4p  . pantoprazole  40 mg Oral Daily  . polyethylene glycol  17 g Oral Daily  . predniSONE  10 mg Oral Q breakfast  .  rivaroxaban  20 mg Oral Q supper  . sodium chloride flush  3 mL Intravenous Q12H   Continuous Infusions:  PRN Meds:.docusate sodium, ipratropium-albuterol  Antibiotics  :   Anti-infectives (From admission, onward)   Start     Dose/Rate Route Frequency Ordered Stop   12/01/19 1600  azithromycin (ZITHROMAX) tablet 250 mg       "Followed  by" Linked Group Details   250 mg Oral Daily 11/30/19 2355 12/04/19 1032   12/01/19 0830  cefTRIAXone (ROCEPHIN) 2 g in sodium chloride 0.9 % 100 mL IVPB  Status:  Discontinued        2 g 200 mL/hr over 30 Minutes Intravenous Every 24 hours 12/01/19 0735 12/02/19 0900   12/01/19 0000  azithromycin (ZITHROMAX) tablet 500 mg       "Followed by" Linked Group Details   500 mg Oral Daily 11/30/19 2355 12/01/19 0128          Objective:   Vitals:   12/09/19 0337 12/09/19 0529 12/09/19 0757 12/09/19 1137  BP: 126/76  108/60   Pulse: (!) 49 (!) 52 (!) 52   Resp: 17 17 18    Temp: 97.6 F (36.4 C)  97.7 F (36.5 C) 98.2 F (36.8 C)  TempSrc: Axillary  Axillary Oral  SpO2: 96% 96% 98%   Weight:  78.7 kg    Height:        SpO2: 98 % O2 Flow Rate (L/min): 3 L/min FiO2 (%): 30 %  Wt Readings from Last 3 Encounters:  12/09/19 78.7 kg     Intake/Output Summary (Last 24 hours) at 12/09/2019 1432 Last data filed at 12/08/2019 1823 Gross per 24 hour  Intake 180 ml  Output --  Net 180 ml     Physical Exam  Awake Alert, Oriented X 3, No new F.N deficits, Normal affect Symmetrical Chest wall movement, Good air movement bilaterally, CTAB RRR,No Gallops,Rubs or new Murmurs, No Parasternal Heave +ve B.Sounds, Abd Soft, No tenderness, No rebound - guarding or rigidity. No Cyanosis, Clubbing or edema, No new Rash or bruise       Data Review:    Recent Labs  Lab 12/03/19 0414 12/04/19 0507 12/05/19 0056 12/06/19 0241 12/07/19 0253  WBC 9.0 8.2 8.7 9.7 9.5  HGB 15.6* 15.2* 15.3* 15.3* 15.0  HCT 51.1* 50.0* 49.5* 49.6* 49.1*  PLT 222 193 209 211 206  MCV 99.0 99.4 97.8 96.9 98.2  MCH 30.2 30.2 30.2 29.9 30.0  MCHC 30.5 30.4 30.9 30.8 30.5  RDW 17.2* 17.0* 16.6* 16.5* 16.7*  LYMPHSABS  --  3.1 2.1 2.9 3.4  MONOABS  --  0.8 0.8 0.8 0.8  EOSABS  --  0.0 0.0 0.0 0.0  BASOSABS  --  0.0 0.0 0.0 0.0    Recent Labs  Lab 12/03/19 0414 12/03/19 0733 12/04/19 0507  12/05/19 0056 12/06/19 0241 12/07/19 0253  NA 143  --  139 139 142 140  K 4.1  --  4.3 4.5 4.5 4.4  CL 94*  --  94* 95* 99 99  CO2 39*  --  37* 35* 33* 31  GLUCOSE 103*  --  102* 89 95 97  BUN 41*  --  37* 38* 37* 33*  CREATININE 1.09*  --  1.10* 1.11* 1.13* 0.98  CALCIUM 8.9  --  9.2 9.1 9.1 9.1  AST  --   --  36 37 28 32  ALT  --   --  38 47* 46* 52*  ALKPHOS  --   --  60 53 58 56  BILITOT  --   --  0.5 0.8 0.9 0.8  ALBUMIN  --   --  3.2* 3.4* 3.4* 3.3*  MG  --   --  2.1 2.1 2.2 2.3  BNP  --  26.9 20.3 20.1 27.3 19.1    Recent Labs  Lab 12/03/19 0733 12/04/19 0507 12/05/19 0056 12/06/19 0241 12/07/19 0253  BNP 26.9 20.3 20.1 27.3 19.1    ------------------------------------------------------------------------------------------------------------------ No results for input(s): CHOL, HDL, LDLCALC, TRIG, CHOLHDL, LDLDIRECT in the last 72 hours.  Lab Results  Component Value Date   HGBA1C 7.0 (H) 12/01/2019   ------------------------------------------------------------------------------------------------------------------ No results for input(s): TSH, T4TOTAL, T3FREE, THYROIDAB in the last 72 hours.  Invalid input(s): FREET3 ------------------------------------------------------------------------------------------------------------------ No results for input(s): VITAMINB12, FOLATE, FERRITIN, TIBC, IRON, RETICCTPCT in the last 72 hours.  Coagulation profile No results for input(s): INR, PROTIME in the last 168 hours.  No results for input(s): DDIMER in the last 72 hours.  Cardiac Enzymes No results for input(s): CKMB, TROPONINI, MYOGLOBIN in the last 168 hours.  Invalid input(s): CK ------------------------------------------------------------------------------------------------------------------    Component Value Date/Time   BNP 19.1 12/07/2019 0253    Micro Results Recent Results (from the past 240 hour(s))  SARS Coronavirus 2 by RT PCR ([facility] order,  performed in [facility]) Nasopharyngeal Nasopharyngeal Swab     Status: None   Collection Time: 11/30/19 11:14 PM   Specimen: Nasopharyngeal Swab  Result Value Ref Range Status   SARS Coronavirus 2 NEGATIVE NEGATIVE Final    Comment: (NOTE) SARS-CoV-2 target nucleic acids are NOT DETECTED.  The SARS-CoV-2 RNA is generally detectable in upper and lower respiratory specimens during the acute phase of infection. The lowest concentration of SARS-CoV-2 viral copies this assay can detect is 250 copies / mL. A negative result does not preclude SARS-CoV-2 infection and should not be used as the sole basis for treatment or other patient management decisions.  A negative result may occur with improper specimen collection / handling, submission of specimen other than nasopharyngeal swab, presence of viral mutation(s) within the areas targeted by this assay, and inadequate number of viral copies (<250 copies / mL). A negative result must be combined with clinical observations, patient history, and epidemiological information.  Fact Sheet for Patients:   StrictlyIdeas.no  Fact Sheet for Healthcare Providers: BankingDealers.co.za  This test is not yet approved or  cleared by the [place] FDA and has been authorized for detection and/or diagnosis of SARS-CoV-2 by FDA under an Emergency Use Authorization (EUA).  This EUA will remain in effect (meaning this test can be used) for the duration of the COVID-19 declaration under Section 564(b)(1) of the Act, 21 U.S.C. section 360bbb-3(b)(1), unless the authorization is terminated or revoked sooner.  Performed at [facility], [place] [address] [place], [place] [address]   MRSA PCR Screening     Status: None   Collection Time: 12/01/19  8:01 AM   Specimen: Nasal Mucosa; Nasopharyngeal  Result Value Ref Range Status   MRSA by PCR NEGATIVE NEGATIVE Final    Comment:        The  GeneXpert MRSA Assay (FDA approved for NASAL specimens only), is one component of a comprehensive MRSA colonization surveillance program. It is not intended to diagnose MRSA infection nor to guide or monitor treatment for MRSA infections. Performed at [facility], [place] [address] [place], [place] [address]   Respiratory Panel by PCR     Status: None  Collection Time: 12/01/19  9:13 AM  Result Value Ref Range Status   Adenovirus NOT DETECTED NOT DETECTED Final   Coronavirus 229E NOT DETECTED NOT DETECTED Final    Comment: (NOTE) The Coronavirus on the Respiratory Panel, DOES NOT test for the novel  Coronavirus (2019 nCoV)    Coronavirus HKU1 NOT DETECTED NOT DETECTED Final   Coronavirus NL63 NOT DETECTED NOT DETECTED Final   Coronavirus OC43 NOT DETECTED NOT DETECTED Final   Metapneumovirus NOT DETECTED NOT DETECTED Final   Rhinovirus / Enterovirus NOT DETECTED NOT DETECTED Final   Influenza A NOT DETECTED NOT DETECTED Final   Influenza B NOT DETECTED NOT DETECTED Final   Parainfluenza Virus 1 NOT DETECTED NOT DETECTED Final   Parainfluenza Virus 2 NOT DETECTED NOT DETECTED Final   Parainfluenza Virus 3 NOT DETECTED NOT DETECTED Final   Parainfluenza Virus 4 NOT DETECTED NOT DETECTED Final   Respiratory Syncytial Virus NOT DETECTED NOT DETECTED Final   Bordetella pertussis NOT DETECTED NOT DETECTED Final   Chlamydophila pneumoniae NOT DETECTED NOT DETECTED Final   Mycoplasma pneumoniae NOT DETECTED NOT DETECTED Final    Comment: Performed at [facility], [place] [address], [place], [place] [address]    Radiology Reports DG Chest 2 View  Result Date: 12/01/2019 CLINICAL DATA:  Acute onset shortness of breath, COPD, hypoxia EXAM: CHEST - 2 VIEW COMPARISON:  11/30/2019 FINDINGS: Frontal and lateral views of the chest demonstrate a stable cardiac silhouette. Stable prominence of the central pulmonary vasculature without airspace disease, effusion, or pneumothorax.  Lungs are mildly hyperinflated consistent with emphysema. No acute bony abnormalities. IMPRESSION: 1. Stable emphysema. 2. Stable vascular congestion without overt edema. Electronically Signed   By: Randa Ngo M.D.   On: 12/01/2019 00:30   DG Chest 2 View  Result Date: 11/30/2019 CLINICAL DATA:  On oxygen did in at home in [place]. She was not allowed to bring her oxygen tank to the [place]. EXAM: CHEST - 2 VIEW COMPARISON:  None. FINDINGS: Enlarged cardiac silhouette. Mildly prominent pulmonary vasculature and interstitial markings without Kerley lines. Small amount of linear atelectasis or scarring in the left lower lung zone and right lung base. The lungs are mildly hyperexpanded. No pleural fluid. Diffuse osteopenia. IMPRESSION: 1. Cardiomegaly and mild pulmonary vascular congestion. 2. Mild changes of COPD. Electronically Signed   By: Claudie Revering M.D.   On: 11/30/2019 19:14   CT CHEST WO CONTRAST  Result Date: 12/06/2019 CLINICAL DATA:  Hypoxia. EXAM: CT CHEST WITHOUT CONTRAST TECHNIQUE: Multidetector CT imaging of the chest was performed following the standard protocol without IV contrast. COMPARISON:  None. FINDINGS: Cardiovascular: There is mild calcification of the aortic arch. Normal heart size. No pericardial effusion. Mediastinum/Nodes: No enlarged mediastinal or axillary lymph nodes. Thyroid gland, trachea, and esophagus demonstrate no significant findings. Lungs/Pleura: Mild linear scarring and/or atelectasis is seen within the posterior aspect of the bilateral lung bases. There is no evidence of a pleural effusion or pneumothorax. Upper Abdomen: A 4.8 cm x 4.1 cm fat and soft tissue density mass is seen within the left adrenal gland. Musculoskeletal: No chest wall mass or suspicious bone lesions identified. IMPRESSION: 1. Mild linear scarring and/or atelectasis within the posterior aspect of the bilateral lung bases. 2. 4.8 cm x 4.1 cm fat and soft tissue density mass within the  left adrenal gland. 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 Correlation with adrenal protocol CT is recommended. 3. Aortic atherosclerosis. Aortic Atherosclerosis (ICD10-I70.0). Electronically Signed   By: Hoover Browns  Houston M.D.   On: 12/06/2019 19:40   ECHOCARDIOGRAM COMPLETE  Result Date: 12/02/2019    ECHOCARDIOGRAM REPORT   Patient Name:   Kelly Booker Date of Exam: 12/02/2019 Medical Rec #:  [id number]   Height: Accession #:    [id number]  Weight:       177.2 lb Date of Birth:  Jan 10, 1951    BSA:          1.863 m Patient Age:    61 years    BP:           92/53 mmHg Patient Gender: F           HR:           67 bpm. Exam Location:  Inpatient Procedure: 2D Echo, Cardiac Doppler, Color Doppler and Intracardiac            Opacification Agent Indications:    I50.9* Heart failure (unspecified)  History:        Patient has no prior history of Echocardiogram examinations.                 COPD; Signs/Symptoms:Dyspnea, Altered Mental Status and                 Shortness of Breath. Hypoxia.  Sonographer:    Roseanna Rainbow RDCS Referring Phys: [id number] [facility] A THOMAS  Sonographer Comments: Technically difficult study due to poor echo windows. IMPRESSIONS  1. Left ventricular ejection fraction, by estimation, is 55 to 60%. The left ventricle has normal function. The left ventricle has no regional wall motion abnormalities. There is mild left ventricular hypertrophy. Left ventricular diastolic parameters are indeterminate.  2. Right ventricular systolic function is normal. The right ventricular size is normal.  3. The mitral valve is grossly normal. Trivial mitral valve regurgitation.  4. The aortic valve is tricuspid. Aortic valve regurgitation is not visualized. Mild aortic valve sclerosis is present, with no evidence of aortic valve stenosis. FINDINGS  Left Ventricle: Left ventricular ejection fraction, by estimation, is 55 to 60%. The left ventricle has normal function. The left ventricle has no regional wall motion  abnormalities. Definity contrast agent was given IV to delineate the left ventricular  endocardial borders. The left ventricular internal cavity size was normal in size. There is mild left ventricular hypertrophy. Left ventricular diastolic parameters are indeterminate. Right Ventricle: The right ventricular size is normal. Right vetricular wall thickness was not assessed. Right ventricular systolic function is normal. Left Atrium: Left atrial size was normal in size. Right Atrium: Right atrial size was normal in size. Pericardium: Trivial pericardial effusion is present. Mitral Valve: The mitral valve is grossly normal. Trivial mitral valve regurgitation. Tricuspid Valve: The tricuspid valve is grossly normal. Tricuspid valve regurgitation is trivial. Aortic Valve: The aortic valve is tricuspid. Aortic valve regurgitation is not visualized. Mild aortic valve sclerosis is present, with no evidence of aortic valve stenosis. Pulmonic Valve: The pulmonic valve was not well visualized. Pulmonic valve regurgitation is trivial. Aorta: The aortic root is normal in size and structure. IAS/Shunts: No atrial level shunt detected by color flow Doppler.  LEFT VENTRICLE PLAX 2D LVIDd:         4.40 cm      Diastology LVIDs:         3.20 cm      LV e' lateral:   6.04 cm/s LV PW:         1.20 cm      LV E/e' lateral: 17.7 LV IVS:  1.10 cm      LV e' medial:    4.95 cm/s LVOT diam:     1.90 cm      LV E/e' medial:  21.6 LV SV:         69 LV SV Index:   37 LVOT Area:     2.84 cm  LV Volumes (MOD) LV vol d, MOD A2C: 91.2 ml LV vol d, MOD A4C: 117.0 ml LV vol s, MOD A2C: 39.8 ml LV vol s, MOD A4C: 47.3 ml LV SV MOD A2C:     51.4 ml LV SV MOD A4C:     117.0 ml LV SV MOD BP:      63.6 ml RIGHT VENTRICLE            IVC RV S prime:     6.74 cm/s  IVC diam: 2.30 cm TAPSE (M-mode): 1.6 cm LEFT ATRIUM             Index       RIGHT ATRIUM           Index LA diam:        3.60 cm 1.93 cm/m  RA Area:     15.90 cm LA Vol (A2C):   36.8 ml  19.75 ml/m RA Volume:   40.70 ml  21.85 ml/m LA Vol (A4C):   42.4 ml 22.76 ml/m LA Biplane Vol: 42.2 ml 22.65 ml/m  AORTIC VALVE LVOT Vmax:   112.00 cm/s LVOT Vmean:  66.500 cm/s LVOT VTI:    0.242 m  AORTA Ao Root diam: 3.50 cm Ao Asc diam:  3.70 cm MITRAL VALVE MV Area (PHT): 3.99 cm     SHUNTS MV Decel Time: 190 msec     Systemic VTI:  0.24 m MV E velocity: 107.00 cm/s  Systemic Diam: 1.90 cm MV A velocity: 114.00 cm/s MV E/A ratio:  0.94 Dorris Carnes MD Electronically signed by Dorris Carnes MD Signature Date/Time: 12/02/2019/1:58:48 PM    Final       Phillips Climes M.D on 12/09/2019 at 2:32 PM  To page go to [email or web address] - password [facility]

## 2019-12-10 DIAGNOSIS — Z9989 Dependence on other enabling machines and devices: Secondary | ICD-10-CM

## 2019-12-10 DIAGNOSIS — G4733 Obstructive sleep apnea (adult) (pediatric): Secondary | ICD-10-CM

## 2019-12-10 LAB — GLUCOSE, CAPILLARY
Glucose-Capillary: 82 mg/dL (ref 70–99)
Glucose-Capillary: 106 mg/dL — ABNORMAL HIGH (ref 70–99)

## 2019-12-10 MED ORDER — METFORMIN HCL 500 MG PO TABS: 500.0000 mg | ORAL_TABLET | Freq: Every day | ORAL | 0 refills | Status: DC

## 2019-12-10 MED FILL — metFORMIN HCL 500 MG TABS: 500 | 30 days supply | Qty: 30 | Fill #0

## 2019-12-10 NOTE — Discharge Summary (Signed)
Kelly Booker, is a 69 y.o. female  DOB [birth year]  MRN [id number].  Admission date:  11/30/2019  Admitting Physician  Clance Boll, MD  Discharge Date:  12/10/2019   Primary MD  Patient, No Pcp Per  Recommendations for primary care physician for things to follow:  -Please continue counseling patient about compliance with oxygen and CPAP. - Patient will need  adrenal protocol CT as outpatient to follow on 4.8 cm x 4.1 cm fat and soft tissue density mass within the left adrenal gland.which may represent an adrenal myelolipoma .   Admission Diagnosis  SOB (shortness of breath) [R06.02] COPD exacerbation (HCC) [J44.1] Pulmonary vascular congestion [R09.89]   Discharge Diagnosis  SOB (shortness of breath) [R06.02] COPD exacerbation (HCC) [J44.1] Pulmonary vascular congestion [R09.89]    Active Problems:   COPD exacerbation (HCC)      Past Medical History:  Diagnosis Date   COPD (chronic obstructive pulmonary disease) ([place])     History reviewed. No pertinent surgical history.     History of present illness and  Hospital Course:     Kindly see H&P for history of present illness and admission details, please review complete Labs, Consult reports and Test reports for all details in brief  HPI  from the history and physical done on the day of admission 11/30/2019  Limited history- patient encephalopathic, translation via ICU nurse given BiPAP Ms. Kelly Booker is a 69 year old woman from [place] who presents after being the country for 4 days without her home nocturnal supplemental oxygen for COPD.  She presents with shortness of breath that she described as typical of her usual COPD exacerbations.  In the room she denies a history of pulmonary disease, tobacco use, or home inhaler or oxygen use.  Per ED documentation she uses 2.5 L of oxygen at night when she is at home.  Her medication names are in  [place] but just thought that she is on Spiriva and Xarelto.  Unsure reason why she takes Xarelto.  In the ED she received bronchodilators, Solu-Medrol, and azithromycin. Her son provided history to the admitting [facility] physician she has been intubated for COPD exacerbation past, most recently a year ago.  At that time she was told to the enlarged heart but did not follow-up with cardiology or take her prescribed cardiac medications.  Her son is unaware of the reason for her Xarelto prescription.  Upon admission to try and get her home oxygen she was found to have hypercapnia was placed on BiPAP.  She became more obtunded overnight and repeat ABG demonstrated worsening hypercapnia with PCO2 greater than 100.  She was transferred to the ICU for pending respiratory failure.  Hospital Course    Acute on chronic hypoxic and hypercapnic respiratory failure  -due to COPD exacerbation. With  underlying OSA and OHS with metabolic encephalopathy. -She was admitted to ICU and treated by pulmonary critical care with continuous BiPAP, IV steroids and supplemental oxygen, her mentation has improved, respiratory status has improved as well. -Currently off BiPAP, requiring CPAP  at bedtime with 2 to 3 L oxygen connected to the CPAP, and requiring 2 to 3 L oxygen at rest and with activity. -Patient was treated with steroids during hospital stay, which has been tapered, no further indication for steroids as an outpatient. - Echo shows chronic diastolic dysfunction with preserved EF of 60%.  Advance activity.  PT OT evaluation. -She remains hypoxic with activity, and with minimal hypoxia at rest, as well at nighttime she did become hypoxic only on CPAP without oxygen, so I have discussed with son, she will need oxygen at rest, with activity and with CPAP. -CT chest was obtained, significant for mild linear scarring/atelectasis of bilateral lung bases. -Patient will need to be compliant with oxygen as an outpatient, as  well she will need CPAP nightly, fortunately her CPAP machine was stuck in the costumes, so case manager has been consulted, and they will arrange for CPAP machine via home health.  History of thromboembolic disease.   - On Xarelto.  Echo stable.  4.8 cm x 4.1 cm fat and soft tissue density mass within the left adrenal gland. 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 Correlation with adrenal protocol CT is recommended.,  This can be done as an outpatient  Morbid obesity.  BMI greater than 35.  Follow with PCP for weight loss.  DM type II.  Currently on sliding scale.    Her A1c is 7, will start on low-dose Metformin .  Discharge Condition:  Stable   Follow UP   Follow-up Information    [facility]. Go on 12/11/2019.   Why: at 8:30am for hospital follow-up Contact information: [place] [address] [phone]                Discharge Instructions  and  Discharge Medications    Discharge Instructions    Discharge instructions   Complete by: As directed    Follow with Primary MD Patient,   Get CBC, CMP,  checked  by Primary MD next visit.    Activity: As tolerated with Full fall precautions use walker/cane & assistance as needed   Disposition Home    Diet: Heart Healthy .  For Heart failure patients - Check your Weight same time everyday, if you gain over 2 pounds, or you develop in leg swelling, experience more shortness of breath or chest pain, call your Primary MD immediately. Follow Cardiac Low Salt Diet and 1.5 lit/day fluid restriction.   On your next visit with your primary care physician please Get Medicines reviewed and adjusted.   Please request your Prim.MD to go over all Hospital Tests and Procedure/Radiological results at the follow up, please get all Hospital records sent to your Prim MD by signing hospital release before you go home.   If you experience worsening  of your admission symptoms, develop shortness of breath, life threatening emergency, suicidal or homicidal thoughts you must seek medical attention immediately by calling 911 or calling your MD immediately  if symptoms less severe.  You Must read complete instructions/literature along with all the possible adverse reactions/side effects for all the Medicines you take and that have been prescribed to you. Take any new Medicines after you have completely understood and accpet all the possible adverse reactions/side effects.   Do not drive, operating heavy machinery, perform activities at heights, swimming or participation in water activities or provide baby sitting services if your were admitted for syncope or siezures until you have  seen by Primary MD or a Neurologist and advised to do so again.  Do not drive when taking Pain medications.    Do not take more than prescribed Pain, Sleep and Anxiety Medications  Special Instructions: If you have smoked or chewed Tobacco  in the last 2 yrs please stop smoking, stop any regular Alcohol  and or any Recreational drug use.  Wear Seat belts while driving.   Please note  You were cared for by a hospitalist during your hospital stay. If you have any questions about your discharge medications or the care you received while you were in the hospital after you are discharged, you can call the unit and asked to speak with the hospitalist on call if the hospitalist that took care of you is not available. Once you are discharged, your primary care physician will handle any further medical issues. Please note that NO REFILLS for any discharge medications will be authorized once you are discharged, as it is imperative that you return to your primary care physician (or establish a relationship with a primary care physician if you do not have one) for your aftercare needs so that they can reassess your need for medications and monitor your lab values.   Increase  activity slowly   Complete by: As directed      Allergies as of 12/10/2019   No Known Allergies     Medication List    TAKE these medications   OXYGEN Inhale 2.5-3 L into the lungs continuous.   PRESCRIPTION MEDICATION Take 1,000 mg by mouth daily at 2 PM. "Daflon" from [place] Take 25 mg by mouth daily. "Spirotard" from [place]   PRESCRIPTION MEDICATION Take 12 mcg by mouth daily. "Aerofor" from [place] -  inhale the contents of one capsule (12 mcg) into the lungs once daily   rivaroxaban 20 MG Tabs tablet Commonly known as: XARELTO Take 20 mg by mouth every morning.   tiotropium 18 MCG inhalation capsule Commonly known as: SPIRIVA Place 18 mcg into inhaler and inhale daily.            Durable Medical Equipment  (From admission, onward)         [place]     Ordered   12/07/19 1538  For home use only DME continuous positive airway pressure (CPAP)  Once       Question Answer Comment  Length of Need Lifetime   Patient has OSA or probable OSA Yes   Is the patient currently using CPAP in the home Yes   If yes (to question two) Determine DME provider and inform them of any new orders/settings   If no (to question two) date of sleep study she is using onr from [place], please connect device to 2 L oxygen.   Date of face to face encounter 12/07/2019   Settings Autotitration   CPAP supplies needed Mask, headgear, cushions, filters, heated tubing and water chamber      12/07/19 1543   12/03/19 1530  For home use only DME oxygen  Once       Question Answer Comment  Length of Need 6 Months   Mode or (Route) Nasal cannula   Liters per Minute 2   Frequency Continuous (stationary and portable oxygen unit needed)   Oxygen delivery system Gas      12/03/19 1530            Diet and Activity recommendation: See Discharge Instructions above   Consults obtained -  PCCM   Major procedures and Radiology Reports - PLEASE review detailed  and final reports for all details, in brief -      DG Chest 2 View  Result Date: 12/01/2019 CLINICAL DATA:  Acute onset shortness of breath, COPD, hypoxia EXAM: CHEST - 2 VIEW COMPARISON:  11/30/2019 FINDINGS: Frontal and lateral views of the chest demonstrate a stable cardiac silhouette. Stable prominence of the central pulmonary vasculature without airspace disease, effusion, or pneumothorax. Lungs are mildly hyperinflated consistent with emphysema. No acute bony abnormalities. IMPRESSION: 1. Stable emphysema. 2. Stable vascular congestion without overt edema. Electronically Signed   By: Randa Ngo M.D.   On: 12/01/2019 00:30   DG Chest 2 View  Result Date: 11/30/2019 CLINICAL DATA:  On oxygen did in at home in [place]. She was not allowed to bring her oxygen tank to the [place]. EXAM: CHEST - 2 VIEW COMPARISON:  None. FINDINGS: Enlarged cardiac silhouette. Mildly prominent pulmonary vasculature and interstitial markings without Kerley lines. Small amount of linear atelectasis or scarring in the left lower lung zone and right lung base. The lungs are mildly hyperexpanded. No pleural fluid. Diffuse osteopenia. IMPRESSION: 1. Cardiomegaly and mild pulmonary vascular congestion. 2. Mild changes of COPD. Electronically Signed   By: Claudie Revering M.D.   On: 11/30/2019 19:14   CT CHEST WO CONTRAST  Result Date: 12/06/2019 CLINICAL DATA:  Hypoxia. EXAM: CT CHEST WITHOUT CONTRAST TECHNIQUE: Multidetector CT imaging of the chest was performed following the standard protocol without IV contrast. COMPARISON:  None. FINDINGS: Cardiovascular: There is mild calcification of the aortic arch. Normal heart size. No pericardial effusion. Mediastinum/Nodes: No enlarged mediastinal or axillary lymph nodes. Thyroid gland, trachea, and esophagus demonstrate no significant findings. Lungs/Pleura: Mild linear scarring and/or atelectasis is seen within the posterior aspect of the bilateral lung bases. There is no  evidence of a pleural effusion or pneumothorax. Upper Abdomen: A 4.8 cm x 4.1 cm fat and soft tissue density mass is seen within the left adrenal gland. Musculoskeletal: No chest wall mass or suspicious bone lesions identified. IMPRESSION: 1. Mild linear scarring and/or atelectasis within the posterior aspect of the bilateral lung bases. 2. 4.8 cm x 4.1 cm fat and soft tissue density mass within the left adrenal gland. 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 Correlation with adrenal protocol CT is recommended. 3. Aortic atherosclerosis. Aortic Atherosclerosis (ICD10-I70.0). Electronically Signed   By: Virgina Norfolk M.D.   On: 12/06/2019 19:40   ECHOCARDIOGRAM COMPLETE  Result Date: 12/02/2019    ECHOCARDIOGRAM REPORT   Patient Name:   SARENITY RAMAKER Date of Exam: 12/02/2019 Medical Rec #:  [id number]   Height: Accession #:    [id number]  Weight:       177.2 lb Date of Birth:  [birth year]    BSA:          1.863 m Patient Age:    60 years    BP:           92/53 mmHg Patient Gender: F           HR:           67 bpm. Exam Location:  Inpatient Procedure: 2D Echo, Cardiac Doppler, Color Doppler and Intracardiac            Opacification Agent Indications:    I50.9* Heart failure (unspecified)  History:        Patient has no prior history of Echocardiogram examinations.  COPD; Signs/Symptoms:Dyspnea, Altered Mental Status and                 Shortness of Breath. Hypoxia.  Sonographer:    Roseanna Rainbow RDCS Referring Phys: [id number] [facility] A THOMAS  Sonographer Comments: Technically difficult study due to poor echo windows. IMPRESSIONS  1. Left ventricular ejection fraction, by estimation, is 55 to 60%. The left ventricle has normal function. The left ventricle has no regional wall motion abnormalities. There is mild left ventricular hypertrophy. Left ventricular diastolic parameters are indeterminate.  2. Right ventricular systolic function is normal. The right ventricular size is normal.  3. The mitral  valve is grossly normal. Trivial mitral valve regurgitation.  4. The aortic valve is tricuspid. Aortic valve regurgitation is not visualized. Mild aortic valve sclerosis is present, with no evidence of aortic valve stenosis. FINDINGS  Left Ventricle: Left ventricular ejection fraction, by estimation, is 55 to 60%. The left ventricle has normal function. The left ventricle has no regional wall motion abnormalities. Definity contrast agent was given IV to delineate the left ventricular  endocardial borders. The left ventricular internal cavity size was normal in size. There is mild left ventricular hypertrophy. Left ventricular diastolic parameters are indeterminate. Right Ventricle: The right ventricular size is normal. Right vetricular wall thickness was not assessed. Right ventricular systolic function is normal. Left Atrium: Left atrial size was normal in size. Right Atrium: Right atrial size was normal in size. Pericardium: Trivial pericardial effusion is present. Mitral Valve: The mitral valve is grossly normal. Trivial mitral valve regurgitation. Tricuspid Valve: The tricuspid valve is grossly normal. Tricuspid valve regurgitation is trivial. Aortic Valve: The aortic valve is tricuspid. Aortic valve regurgitation is not visualized. Mild aortic valve sclerosis is present, with no evidence of aortic valve stenosis. Pulmonic Valve: The pulmonic valve was not well visualized. Pulmonic valve regurgitation is trivial. Aorta: The aortic root is normal in size and structure. IAS/Shunts: No atrial level shunt detected by color flow Doppler.  LEFT VENTRICLE PLAX 2D LVIDd:         4.40 cm      Diastology LVIDs:         3.20 cm      LV e' lateral:   6.04 cm/s LV PW:         1.20 cm      LV E/e' lateral: 17.7 LV IVS:        1.10 cm      LV e' medial:    4.95 cm/s LVOT diam:     1.90 cm      LV E/e' medial:  21.6 LV SV:         69 LV SV Index:   37 LVOT Area:     2.84 cm  LV Volumes (MOD) LV vol d, MOD A2C: 91.2 ml LV vol  d, MOD A4C: 117.0 ml LV vol s, MOD A2C: 39.8 ml LV vol s, MOD A4C: 47.3 ml LV SV MOD A2C:     51.4 ml LV SV MOD A4C:     117.0 ml LV SV MOD BP:      63.6 ml RIGHT VENTRICLE            IVC RV S prime:     6.74 cm/s  IVC diam: 2.30 cm TAPSE (M-mode): 1.6 cm LEFT ATRIUM             Index       RIGHT ATRIUM  Index LA diam:        3.60 cm 1.93 cm/m  RA Area:     15.90 cm LA Vol (A2C):   36.8 ml 19.75 ml/m RA Volume:   40.70 ml  21.85 ml/m LA Vol (A4C):   42.4 ml 22.76 ml/m LA Biplane Vol: 42.2 ml 22.65 ml/m  AORTIC VALVE LVOT Vmax:   112.00 cm/s LVOT Vmean:  66.500 cm/s LVOT VTI:    0.242 m  AORTA Ao Root diam: 3.50 cm Ao Asc diam:  3.70 cm MITRAL VALVE MV Area (PHT): 3.99 cm     SHUNTS MV Decel Time: 190 msec     Systemic VTI:  0.24 m MV E velocity: 107.00 cm/s  Systemic Diam: 1.90 cm MV A velocity: 114.00 cm/s MV E/A ratio:  0.94 Dorris Carnes MD Electronically signed by Dorris Carnes MD Signature Date/Time: 12/02/2019/1:58:48 PM    Final     Micro Results     Recent Results (from the past 240 hour(s))  SARS Coronavirus 2 by RT PCR (hospital order, performed in [place] hospital lab) Nasopharyngeal Nasopharyngeal Swab     Status: None   Collection Time: 11/30/19 11:14 PM   Specimen: Nasopharyngeal Swab  Result Value Ref Range Status   SARS Coronavirus 2 NEGATIVE NEGATIVE Final    Comment: (NOTE) SARS-CoV-2 target nucleic acids are NOT DETECTED.  The SARS-CoV-2 RNA is generally detectable in upper and lower respiratory specimens during the acute phase of infection. The lowest concentration of SARS-CoV-2 viral copies this assay can detect is 250 copies / mL. A negative result does not preclude SARS-CoV-2 infection and should not be used as the sole basis for treatment or other patient management decisions.  A negative result may occur with improper specimen collection / handling, submission of specimen other than nasopharyngeal swab, presence of viral mutation(s) within the areas  targeted by this assay, and inadequate number of viral copies (<250 copies / mL). A negative result must be combined with clinical observations, patient history, and epidemiological information.  Fact Sheet for Patients:   StrictlyIdeas.no  Fact Sheet for Healthcare Providers: BankingDealers.co.za  This test is not yet approved or  cleared by the [place] FDA and has been authorized for detection and/or diagnosis of SARS-CoV-2 by FDA under an Emergency Use Authorization (EUA).  This EUA will remain in effect (meaning this test can be used) for the duration of the COVID-19 declaration under Section 564(b)(1) of the Act, 21 U.S.C. section 360bbb-3(b)(1), unless the authorization is terminated or revoked sooner.  Performed at [facility], [place] [address] [place], [place] [address]   MRSA PCR Screening     Status: None   Collection Time: 12/01/19  8:01 AM   Specimen: Nasal Mucosa; Nasopharyngeal  Result Value Ref Range Status   MRSA by PCR NEGATIVE NEGATIVE Final    Comment:        The GeneXpert MRSA Assay (FDA approved for NASAL specimens only), is one component of a comprehensive MRSA colonization surveillance program. It is not intended to diagnose MRSA infection nor to guide or monitor treatment for MRSA infections. Performed at [facility], [place] [address] [place], [place] [address]   Respiratory Panel by PCR     Status: None   Collection Time: 12/01/19  9:13 AM  Result Value Ref Range Status   Adenovirus NOT DETECTED NOT DETECTED Final   Coronavirus 229E NOT DETECTED NOT DETECTED Final    Comment: (NOTE) The Coronavirus on the Respiratory  Panel, DOES NOT test for the novel  Coronavirus (2019 nCoV)    Coronavirus HKU1 NOT DETECTED NOT DETECTED Final   Coronavirus NL63 NOT DETECTED NOT DETECTED Final   Coronavirus OC43 NOT DETECTED NOT DETECTED Final   Metapneumovirus NOT DETECTED NOT DETECTED Final    Rhinovirus / Enterovirus NOT DETECTED NOT DETECTED Final   Influenza A NOT DETECTED NOT DETECTED Final   Influenza B NOT DETECTED NOT DETECTED Final   Parainfluenza Virus 1 NOT DETECTED NOT DETECTED Final   Parainfluenza Virus 2 NOT DETECTED NOT DETECTED Final   Parainfluenza Virus 3 NOT DETECTED NOT DETECTED Final   Parainfluenza Virus 4 NOT DETECTED NOT DETECTED Final   Respiratory Syncytial Virus NOT DETECTED NOT DETECTED Final   Bordetella pertussis NOT DETECTED NOT DETECTED Final   Chlamydophila pneumoniae NOT DETECTED NOT DETECTED Final   Mycoplasma pneumoniae NOT DETECTED NOT DETECTED Final    Comment: Performed at [facility], [place] [address] [place],  [address]       Today   Subjective:   Donna Snooks today has no headache,no chest abdominal pain,no new weakness tingling or numbness, feels much better wants to go home today.   Objective:   Blood pressure 116/60, pulse 68, temperature 97.7 F (36.5 C), temperature source Oral, resp. rate (!) 21, height 5\' 1"  (1.549 m), weight 80.9 kg, SpO2 90 %.  No intake or output data in the 24 hours ending 12/10/19 1202  Exam Awake Alert, Oriented x 3, No new F.N deficits, Normal affect Symmetrical Chest wall movement, Good air movement bilaterally, CTAB RRR,No Gallops,Rubs or new Murmurs, No Parasternal Heave +ve B.Sounds, Abd Soft, Non tender, No organomegaly appriciated, No rebound -guarding or rigidity. No Cyanosis, Clubbing or edema, No new Rash or bruise  Patient was seen via tele interpreter  Data Review   CBC w Diff:  Lab Results  Component Value Date   WBC 9.5 12/07/2019   HGB 15.0 12/07/2019   HCT 49.1 (H) 12/07/2019   PLT 206 12/07/2019   LYMPHOPCT 36 12/07/2019   MONOPCT 8 12/07/2019   EOSPCT 0 12/07/2019   BASOPCT 0 12/07/2019    CMP:  Lab Results  Component Value Date   NA 140 12/07/2019   K 4.4 12/07/2019   CL 99 12/07/2019   CO2 31 12/07/2019   BUN 33 (H) 12/07/2019   CREATININE  0.98 12/07/2019   PROT 6.6 12/07/2019   ALBUMIN 3.3 (L) 12/07/2019   BILITOT 0.8 12/07/2019   ALKPHOS 56 12/07/2019   AST 32 12/07/2019   ALT 52 (H) 12/07/2019  .   Total Time in preparing paper work, data evaluation and todays exam - 33 minutes  Phillips Climes M.D on 12/10/2019 at 12:02 PM  [facility]   Office  [phone]

## 2019-12-10 NOTE — TOC Progression Note (Addendum)
Transition of Care [facility]) - Progression Note    Patient Details  Name: Kelly Booker MRN: [id number] Date of Birth: January 25, 1951  [facility] CM/SW Contact  Carles Collet, RN Phone Number: 12/10/2019, 9:33 AM  Clinical Narrative:    Checked in with son [name] to see if he heard anything about CPAP machne being released from customs. He will call me back in a few minutes. CPAP can also be provided through LOG by [employer] today if [facility] not able to secure it. Received call back from [place], he is unable to get CPAP from customs today. Notified Zach w [employer] that we will need to process LOG for CPAP with hopes of having it today for potential DC  15:25 Spoke w son [name], CPAP and O2 concentrator has been delivered. Transport O2 at bedside. Family to provide transport home.      Expected Discharge Plan: Home/Self Care Barriers to Discharge: Continued Medical Work up  Expected Discharge Plan and Services Expected Discharge Plan: Home/Self Care   Discharge Planning Services: CM Consult                     DME Arranged: NIV DME Agency: [employer] Date DME Agency Contacted: 12/03/19 Time DME Agency Contacted: 1205 Representative spoke with at DME Agency: [place] Interventions    Readmission Risk Interventions No flowsheet data found.

## 2019-12-10 NOTE — Progress Notes (Signed)
[name] to be D/C'd Home per MD order.  Discussed with the patient and all questions fully answered.  VSS, Skin clean, dry and intact without evidence of skin break down, no evidence of skin tears noted. IV catheter discontinued intact. Site without signs and symptoms of complications. Dressing and pressure applied.  An After Visit Summary was printed and given to the patient. Patient received medication.  D/c education completed with patient & family including follow up instructions, medication list, d/c activities limitations if indicated, with other d/c instructions as indicated by MD - patient able to verbalize understanding, all questions fully answered.   Patient instructed to return to ED, call 911, or call MD for any changes in condition.   Patient escorted via [place], and D/C home via private auto.  Kelly Booker 12/10/2019 4:10 PM

## 2019-12-11 ENCOUNTER — Ambulatory Visit (INDEPENDENT_AMBULATORY_CARE_PROVIDER_SITE_OTHER): Payer: Self-pay | Admitting: Primary Care

## 2019-12-17 ENCOUNTER — Other Ambulatory Visit: Payer: Self-pay

## 2019-12-17 ENCOUNTER — Ambulatory Visit (INDEPENDENT_AMBULATORY_CARE_PROVIDER_SITE_OTHER): Payer: Self-pay | Admitting: Primary Care

## 2019-12-17 ENCOUNTER — Encounter (INDEPENDENT_AMBULATORY_CARE_PROVIDER_SITE_OTHER): Payer: Self-pay | Admitting: Primary Care

## 2019-12-17 VITALS — BP 120/78 | HR 81 | Temp 98.4°F | Ht 61.0 in | Wt 172.2 lb

## 2019-12-17 DIAGNOSIS — M79642 Pain in left hand: Secondary | ICD-10-CM

## 2019-12-17 DIAGNOSIS — E119 Type 2 diabetes mellitus without complications: Secondary | ICD-10-CM

## 2019-12-17 DIAGNOSIS — Z09 Encounter for follow-up examination after completed treatment for conditions other than malignant neoplasm: Secondary | ICD-10-CM

## 2019-12-17 DIAGNOSIS — E278 Other specified disorders of adrenal gland: Secondary | ICD-10-CM

## 2019-12-17 DIAGNOSIS — Z7689 Persons encountering health services in other specified circumstances: Secondary | ICD-10-CM

## 2019-12-17 LAB — GLUCOSE, POCT (MANUAL RESULT ENTRY): POC Glucose: 61 mg/dl — AB (ref 70–99)

## 2019-12-17 MED ORDER — METFORMIN HCL 500 MG PO TABS: 500.0000 mg | ORAL_TABLET | Freq: Two times a day (BID) | ORAL | 3 refills | Status: AC

## 2019-12-17 MED ORDER — TRAMADOL HCL 50 MG PO TABS: 50.0000 mg | ORAL_TABLET | Freq: Three times a day (TID) | ORAL | 0 refills | Status: AC | PRN

## 2019-12-17 MED ORDER — RIVAROXABAN 20 MG PO TABS: 20.0000 mg | ORAL_TABLET | Freq: Every morning | ORAL | 0 refills | Status: AC

## 2019-12-17 MED ORDER — TIOTROPIUM BROMIDE MONOHYDRATE 18 MCG IN CAPS: 18.0000 ug | ORAL_CAPSULE | Freq: Every day | RESPIRATORY_TRACT | 1 refills | Status: AC

## 2019-12-17 NOTE — Progress Notes (Signed)
New Patient Office Visit  Subjective:  Patient ID: Kelly Booker, female    DOB: 08-04-1950  Age: 69 y.o. MRN: [id number]  CC:  Chief Complaint  Patient presents with  . New Patient (Initial Visit)    COPD   . Edema    of left hand for 2 days     HPI [name] is a 69 year old female speaks Spanish only her son Kelly Booker is her interpretor. She presents today to establish care and left hand is swollen from wrist to fingers. She denies any trauma.  Hospital follow up - Admit date to the hospital was 11/30/19, patient was discharged from the hospital on 12/10/19, patient was admitted for: Acute on chronic hypoxic and hypercapnic respiratory failure  -due to COPD exacerbation. With underlying OSA and OHS with metabolic encephalopathyhypoxia She was not allowed to carrying on the plane.   Past Medical History:  Diagnosis Date  . COPD (chronic obstructive pulmonary disease) (HCC)     No past surgical history on file.  No family history on file.  Social History   Socioeconomic History  . Marital status: Unknown    Spouse name: Not on file  . Number of children: Not on file  . Years of education: Not on file  . Highest education level: Not on file  Occupational History  . Not on file  Tobacco Use  . Smoking status: Never Smoker  . Smokeless tobacco: Never Used  Vaping Use  . Vaping Use: Never used  Substance and Sexual Activity  . Alcohol use: Never  . Drug use: Never  . Sexual activity: Not on file  Other Topics Concern  . Not on file  Social History Narrative  . Not on file   Social Determinants of Health   Financial Resource Strain:   . Difficulty of Paying Living Expenses:   Food Insecurity:   . Worried About Charity fundraiser in the Last Year:   . Arboriculturist in the Last Year:   Transportation Needs:   . Film/video editor (Medical):   Marland Kitchen Lack of Transportation (Non-Medical):   Physical Activity:   . Days of Exercise per Week:   . Minutes of  Exercise per Session:   Stress:   . Feeling of Stress :   Social Connections:   . Frequency of Communication with Friends and Family:   . Frequency of Social Gatherings with Friends and Family:   . Attends Religious Services:   . Active Member of Clubs or Organizations:   . Attends Archivist Meetings:   Marland Kitchen Marital Status:   Intimate Partner Violence:   . Fear of Current or Ex-Partner:   . Emotionally Abused:   Marland Kitchen Physically Abused:   . Sexually Abused:     ROS Review of Systems  Musculoskeletal:       Left hand is swollen from wrist to fingers.  All other systems reviewed and are negative.   Objective:   Today's Vitals: BP 120/78 (BP Location: Right Arm, Patient Position: Sitting, Cuff Size: Normal)   Pulse 81   Temp 98.4 F (36.9 C) (Oral)   Ht 5\' 1"  (1.549 m)   Wt 172 lb 3.2 oz (78.1 kg)   SpO2 92%   BMI 32.54 kg/m   Physical Exam Vitals reviewed.  Constitutional:      Appearance: She is obese.  HENT:     Head: Normocephalic.     Right Ear: Tympanic membrane normal.  Left Ear: Tympanic membrane normal.  Eyes:     Extraocular Movements: Extraocular movements intact.     Pupils: Pupils are equal, round, and reactive to light.  Cardiovascular:     Rate and Rhythm: Normal rate and regular rhythm.     Pulses: Normal pulses.     Heart sounds: Normal heart sounds.  Pulmonary:     Effort: Pulmonary effort is normal.     Breath sounds: Normal breath sounds.  Abdominal:     General: Bowel sounds are normal.  Musculoskeletal:        General: Normal range of motion.     Cervical back: Normal range of motion and neck supple.  Skin:    General: Skin is warm and dry.  Neurological:     Mental Status: She is alert and oriented to person, place, and time.  Psychiatric:        Mood and Affect: Mood normal.        Behavior: Behavior normal.        Thought Content: Thought content normal.        Judgment: Judgment normal.     Assessment & Plan:  Kelly Booker  was seen today for new patient (initial visit) and edema.  Diagnoses and all orders for this visit:  Encounter to establish care Kelly Mire, NP-C will be your  (PCP) she is mastered prepared . Able to diagnosed and treatment also  answer health concern as well as continuing care of varied medical conditions, not limited by cause, organ system, or diagnosis.   Type 2 diabetes mellitus without complication, without long-term current use of insulin (HCC) ADA recommends the following therapeutic goals for glycemic control related to A1c measurements: Goal of therapy: Less than 6.5 hemoglobin A1c. Decrease foods that are high in carbohydrates are the following rice, potatoes, breads, sugars, and pastas.  Reduction in the intake (eating) will assist in lowering your blood sugars. -     Glucose (CBG) A1C 7.0  Increased metformin 500 mg twice a day after meals   Hospital discharge follow-up Recommendations for primary care physician for things to follow:   counseled patient about compliance with oxygen and CPAP. Patient will need  adrenal protocol CT as outpatient to follow on 4.8 cm x 4.1 cm fat and soft tissue density mass within the left adrenal  gland.which may represent an adrenal myelolipoma .  Adrenal mass greater than 4 cm in diameter (HCC) 4.1 cm fat and soft tissue density mass within the left adrenal  gland.which may represent an adrenal myelolipoma . Refer to endocrinology and urology   Hand pain, left Left wrist to fingers warm, swollen and painful ROM limited increase causes pain. Do not feel fracture is present if hand remains swollen 3 day refer for x-ray.  Advise wrist brace and ibuprofen for pain.    Outpatient Encounter Medications as of 12/17/2019  Medication Sig  . metFORMIN (GLUCOPHAGE) 500 MG tablet Take 1 tablet (500 mg total) by mouth daily with breakfast.  . OXYGEN Inhale 2.5-3 L into the lungs continuous.   Marland Kitchen PRESCRIPTION MEDICATION Take 1,000 mg by mouth  daily at 2 PM. "Daflon" from [place]  . PRESCRIPTION MEDICATION Take 25 mg by mouth daily. "Spirotard" from [place]  . PRESCRIPTION MEDICATION Take 12 mcg by mouth daily. "Aerofor" from [place] -  inhale the contents of one capsule (12 mcg) into the lungs once daily  . rivaroxaban (XARELTO) 20 MG TABS tablet Take 20 mg by mouth every morning.  Marland Kitchen  tiotropium (SPIRIVA) 18 MCG inhalation capsule Place 18 mcg into inhaler and inhale daily.   No facility-administered encounter medications on file as of 12/17/2019.    Follow-up: No follow-ups on file.   Kerin Perna, NP

## 2019-12-17 NOTE — Patient Instructions (Signed)

## 2019-12-18 LAB — COMPREHENSIVE METABOLIC PANEL
ALT: 20 IU/L (ref 0–32)
AST: 12 IU/L (ref 0–40)
Albumin/Globulin Ratio: 1.3 (ref 1.2–2.2)
Albumin: 4.1 g/dL (ref 3.8–4.8)
Alkaline Phosphatase: 92 IU/L (ref 48–121)
BUN/Creatinine Ratio: 21 (ref 12–28)
BUN: 20 mg/dL (ref 8–27)
Bilirubin Total: 0.4 mg/dL (ref 0.0–1.2)
CO2: 27 mmol/L (ref 20–29)
Calcium: 9.6 mg/dL (ref 8.7–10.3)
Chloride: 101 mmol/L (ref 96–106)
Creatinine, Ser: 0.96 mg/dL (ref 0.57–1.00)
GFR calc Af Amer: 70 mL/min/{1.73_m2} (ref >59)
GFR calc non Af Amer: 61 mL/min/{1.73_m2} (ref >59)
Globulin, Total: 3.2 g/dL (ref 1.5–4.5)
Glucose: 66 mg/dL (ref 65–99)
Potassium: 4.2 mmol/L (ref 3.5–5.2)
Sodium: 144 mmol/L (ref 134–144)
Total Protein: 7.3 g/dL (ref 6.0–8.5)

## 2019-12-18 LAB — CBC WITH DIFFERENTIAL/PLATELET
Basophils Absolute: 0 10*3/uL (ref 0.0–0.2)
Basos: 0 %
EOS (ABSOLUTE): 0.1 10*3/uL (ref 0.0–0.4)
Eos: 1 %
Hematocrit: 47.6 % — ABNORMAL HIGH (ref 34.0–46.6)
Hemoglobin: 15.5 g/dL (ref 11.1–15.9)
Immature Grans (Abs): 0 10*3/uL (ref 0.0–0.1)
Immature Granulocytes: 0 %
Lymphocytes Absolute: 2.5 10*3/uL (ref 0.7–3.1)
Lymphs: 25 %
MCH: 30.3 pg (ref 26.6–33.0)
MCHC: 32.6 g/dL (ref 31.5–35.7)
MCV: 93 fL (ref 79–97)
Monocytes Absolute: 1.1 10*3/uL — ABNORMAL HIGH (ref 0.1–0.9)
Monocytes: 11 %
Neutrophils Absolute: 6.2 10*3/uL (ref 1.4–7.0)
Neutrophils: 63 %
Platelets: 233 10*3/uL (ref 150–450)
RBC: 5.11 x10E6/uL (ref 3.77–5.28)
RDW: 14.3 % (ref 11.7–15.4)
WBC: 10 10*3/uL (ref 3.4–10.8)

## 2019-12-20 ENCOUNTER — Telehealth (INDEPENDENT_AMBULATORY_CARE_PROVIDER_SITE_OTHER): Payer: Self-pay

## 2019-12-20 NOTE — Telephone Encounter (Signed)
-----   Message from Kerin Perna, NP sent at 12/18/2019  7:58 PM EDT ----- Lab results are normal to include kidney function, electrolytes and there is a slight elevation in ALT a liver enzyme can be results of alcohol  overuse or Tylenol

## 2019-12-20 NOTE — Telephone Encounter (Signed)
Call placed to patient using pacific interpreter([id number]) patient and son aware of normal results. Nat Christen, CMA

## 2020-03-02 ENCOUNTER — Emergency Department (HOSPITAL_COMMUNITY)
Admission: EM | Admit: 2020-03-02 | Discharge: 2020-03-03 | Disposition: A | Discharge status: Home / Self Care | Payer: Self-pay | Attending: Emergency Medicine | Admitting: Emergency Medicine

## 2020-03-02 ENCOUNTER — Encounter (HOSPITAL_COMMUNITY): Payer: Self-pay | Admitting: Emergency Medicine

## 2020-03-02 ENCOUNTER — Other Ambulatory Visit: Payer: Self-pay

## 2020-03-02 DIAGNOSIS — Z20822 Contact with and (suspected) exposure to covid-19: Secondary | ICD-10-CM | POA: Insufficient documentation

## 2020-03-02 DIAGNOSIS — Z7984 Long term (current) use of oral hypoglycemic drugs: Secondary | ICD-10-CM | POA: Insufficient documentation

## 2020-03-02 DIAGNOSIS — R8271 Bacteriuria: Secondary | ICD-10-CM

## 2020-03-02 DIAGNOSIS — R6883 Chills (without fever): Secondary | ICD-10-CM | POA: Insufficient documentation

## 2020-03-02 DIAGNOSIS — J441 Chronic obstructive pulmonary disease with (acute) exacerbation: Secondary | ICD-10-CM | POA: Insufficient documentation

## 2020-03-02 DIAGNOSIS — Z7901 Long term (current) use of anticoagulants: Secondary | ICD-10-CM | POA: Insufficient documentation

## 2020-03-02 DIAGNOSIS — R5383 Other fatigue: Secondary | ICD-10-CM | POA: Insufficient documentation

## 2020-03-02 LAB — BASIC METABOLIC PANEL
Anion gap: 10 (ref 5–15)
BUN: 19 mg/dL (ref 8–23)
CO2: 40 mmol/L — ABNORMAL HIGH (ref 22–32)
Calcium: 9.9 mg/dL (ref 8.9–10.3)
Chloride: 94 mmol/L — ABNORMAL LOW (ref 98–111)
Creatinine, Ser: 0.97 mg/dL (ref 0.44–1.00)
GFR calc Af Amer: >60 mL/min (ref >60)
GFR calc non Af Amer: >60 mL/min (ref >60)
Glucose, Bld: 94 mg/dL (ref 70–99)
Potassium: 4.1 mmol/L (ref 3.5–5.1)
Sodium: 144 mmol/L (ref 135–145)

## 2020-03-02 LAB — CBC
HCT: 49.8 % — ABNORMAL HIGH (ref 36.0–46.0)
Hemoglobin: 14.7 g/dL (ref 12.0–15.0)
MCH: 30.4 pg (ref 26.0–34.0)
MCHC: 29.5 g/dL — ABNORMAL LOW (ref 30.0–36.0)
MCV: 102.9 fL — ABNORMAL HIGH (ref 80.0–100.0)
Platelets: 205 10*3/uL (ref 150–400)
RBC: 4.84 MIL/uL (ref 3.87–5.11)
RDW: 15.2 % (ref 11.5–15.5)
WBC: 9.8 10*3/uL (ref 4.0–10.5)
nRBC: 0 % (ref 0.0–0.2)

## 2020-03-02 NOTE — ED Notes (Signed)
RN placed pt on 3L.

## 2020-03-02 NOTE — ED Triage Notes (Signed)
Pt from home, initial O2 was 77% on room air.  When pt was placed on 3L O2 [place] (home air), O2 increased to 99%. Pt states that when she tries to stand up she "feels like I'm going to pass out."  Pt states she thinks "something might be wrong w/ my tank at home... b/c my O2 does not go up when I'm on it."  Denies any other symptoms at this time.  Reports this happened 85months ago as well.

## 2020-03-03 ENCOUNTER — Encounter (HOSPITAL_COMMUNITY): Payer: Self-pay | Admitting: Student

## 2020-03-03 LAB — URINALYSIS, ROUTINE W REFLEX MICROSCOPIC
Bilirubin Urine: NEGATIVE
Glucose, UA: NEGATIVE mg/dL
Hgb urine dipstick: NEGATIVE
Ketones, ur: NEGATIVE mg/dL
Nitrite: NEGATIVE
Protein, ur: 100 mg/dL — AB
Specific Gravity, Urine: 1.017 (ref 1.005–1.030)
pH: 5 (ref 5.0–8.0)

## 2020-03-03 LAB — TSH: TSH: 4.328 u[IU]/mL (ref 0.350–4.500)

## 2020-03-03 LAB — HEPATIC FUNCTION PANEL
ALT: 15 U/L (ref 0–44)
AST: 24 U/L (ref 15–41)
Albumin: 3.7 g/dL (ref 3.5–5.0)
Alkaline Phosphatase: 83 U/L (ref 38–126)
Bilirubin, Direct: 0.3 mg/dL — ABNORMAL HIGH (ref 0.0–0.2)
Indirect Bilirubin: 0.8 mg/dL (ref 0.3–0.9)
Total Bilirubin: 1.1 mg/dL (ref 0.3–1.2)
Total Protein: 7.2 g/dL (ref 6.5–8.1)

## 2020-03-03 LAB — RESPIRATORY PANEL BY RT PCR (FLU A&B, COVID)
Influenza A by PCR: NEGATIVE
Influenza B by PCR: NEGATIVE
SARS Coronavirus 2 by RT PCR: NEGATIVE

## 2020-03-03 MED ORDER — CEPHALEXIN 500 MG PO CAPS
500.0000 mg | ORAL_CAPSULE | Freq: Three times a day (TID) | ORAL | 0 refills | Status: AC
Start: 2020-03-03 — End: 2020-03-10

## 2020-03-03 NOTE — ED Notes (Signed)
98% while ambulating

## 2020-03-03 NOTE — ED Provider Notes (Signed)
Care assumed at shift change from [facility], PA-C, pending U/A and discharge. See their note for full HPI and workup. Briefly, pt presenting for fatigue and inc sleepiness x5-6 days. Urinary frequency, no CP or SOB, no near syncope. 3L at baseline, 98% with ambulation - at baseline. Plan to follow U/A, treat keflex x 7days if UTI present and discharge to home. Physical Exam  BP 111/72   Pulse 89   Temp 97.7 F (36.5 C) (Oral)   Resp 18   Ht 5\' 1"  (1.549 m)   Wt 78.1 kg   SpO2 97%   BMI 32.53 kg/m   Physical Exam Vitals and nursing note reviewed.  Constitutional:      General: She is not in acute distress.    Appearance: She is well-developed.  Cardiovascular:     Rate and Rhythm: Normal rate.  Pulmonary:     Effort: Pulmonary effort is normal.  Neurological:     Mental Status: She is alert.     ED Course/Procedures     Procedures Results for orders placed or performed during the hospital encounter of 03/02/20  Respiratory Panel by RT PCR (Flu A&B, Covid) - Nasopharyngeal Swab   Specimen: Nasopharyngeal Swab  Result Value Ref Range   SARS Coronavirus 2 by RT PCR NEGATIVE NEGATIVE   Influenza A by PCR NEGATIVE NEGATIVE   Influenza B by PCR NEGATIVE NEGATIVE  Basic metabolic panel  Result Value Ref Range   Sodium 144 135 - 145 mmol/L   Potassium 4.1 3.5 - 5.1 mmol/L   Chloride 94 (L) 98 - 111 mmol/L   CO2 40 (H) 22 - 32 mmol/L   Glucose, Bld 94 70 - 99 mg/dL   BUN 19 8 - 23 mg/dL   Creatinine, Ser 0.97 0.44 - 1.00 mg/dL   Calcium 9.9 8.9 - 10.3 mg/dL   GFR calc non Af Amer >60 >60 mL/min   GFR calc Af Amer >60 >60 mL/min   Anion gap 10 5 - 15  CBC  Result Value Ref Range   WBC 9.8 4.0 - 10.5 K/uL   RBC 4.84 3.87 - 5.11 MIL/uL   Hemoglobin 14.7 12.0 - 15.0 g/dL   HCT 49.8 (H) 36 - 46 %   MCV 102.9 (H) 80.0 - 100.0 fL   MCH 30.4 26.0 - 34.0 pg   MCHC 29.5 (L) 30.0 - 36.0 g/dL   RDW 15.2 11.5 - 15.5 %   Platelets 205 150 - 400 K/uL   nRBC 0.0 0.0 - 0.2 %   Urinalysis, Routine w reflex microscopic Urine, Clean Catch  Result Value Ref Range   Color, Urine AMBER (A) YELLOW   APPearance CLOUDY (A) CLEAR   Specific Gravity, Urine 1.017 1.005 - 1.030   pH 5.0 5.0 - 8.0   Glucose, UA NEGATIVE NEGATIVE mg/dL   Hgb urine dipstick NEGATIVE NEGATIVE   Bilirubin Urine NEGATIVE NEGATIVE   Ketones, ur NEGATIVE NEGATIVE mg/dL   Protein, ur 100 (A) NEGATIVE mg/dL   Nitrite NEGATIVE NEGATIVE   Leukocytes,Ua TRACE (A) NEGATIVE   RBC / HPF 0-5 0 - 5 RBC/hpf   WBC, UA 11-20 0 - 5 WBC/hpf   Bacteria, UA RARE (A) NONE SEEN   Squamous Epithelial / LPF 11-20 0 - 5   Mucus PRESENT    Hyaline Casts, UA PRESENT   Hepatic function panel  Result Value Ref Range   Total Protein 7.2 6.5 - 8.1 g/dL   Albumin 3.7 3.5 - 5.0 g/dL  AST 24 15 - 41 U/L   ALT 15 0 - 44 U/L   Alkaline Phosphatase 83 38 - 126 U/L   Total Bilirubin 1.1 0.3 - 1.2 mg/dL   Bilirubin, Direct 0.3 (H) 0.0 - 0.2 mg/dL   Indirect Bilirubin 0.8 0.3 - 0.9 mg/dL  TSH  Result Value Ref Range   TSH 4.328 0.350 - 4.500 uIU/mL   No results found.  MDM  U/A with rare bacteria, 11-20 WBC, trace leuks, and 11-20 squamous. Per previous provider plan, treat if mild UTI present. Urine culture sent. Will treat with keflex. TSH is high normal. Recommend recheck with PCP.        Kelly Booker, [place] N, PA-C 03/03/20 1756    Lajean Saver, MD 03/04/20 [phone]

## 2020-03-03 NOTE — Discharge Instructions (Signed)
Your COVID test is negative.  You urine shows a small amount of bacteria. We will treat this with antibiotic, Keflex. Take this as directed until gone.  Follow with your primary care provider regarding your visit today.

## 2020-03-03 NOTE — ED Provider Notes (Signed)
[facility] EMERGENCY DEPARTMENT Provider Note   CSN: [id number] Arrival date & time: 03/02/20  1820    History Chief Complaint  Patient presents with  . Fatigue    Kelly Booker is a 69 y.o. female with a history of COPD w/ chronic respiratory failure 2-3L at baseline & OSA on CPAP at night who presents to the ED with complaints of increased fatigue over the past 5-6 days. Patient states that she has felt more fatigued than usual, has less interest in activities, and feels generally tired with desire to sleep more.  Has had intermittent chills as well.  No specific alleviating or aggravating factors.  Her family was concerned about her therefore they brought her to the emergency department.  She lives in [place] and is currently visiting.  She had a recent hospital admission 3 months prior for hypercapnia requiring BiPAP, ultimately was discharged home on 2 to 3 L via nasal cannula at baseline as well as CPAP at night.  She has been utilizing this and it has been functioning appropriately despite triage note, she also denies any dizziness/lightheadedness/near syncope to me as well.  She states that she has felt more down/depressed than usual, her younger son recently passed away in 02/17/2023 of this year and her husband passed away last year.  Also mentions some urinary frequency. She denies any suicidal ideation, homicidal ideation, or hallucinations.  She denies any chest pain, shortness of breath, cough, fever, dizziness, lightheadedness, abdominal pain, flank pain, or dysuria to me.  Translator line utilized throughout Sales executive.  I called and spoke with the patient's son [name] via telephone who is able to communicate in [place], confirmed above, states he just wanted to make sure she was okay prior to going back to [place] tomorrow, states she has been utilizing her oxygen appropriately and has not had any syncopal or near syncopal episodes.   HPI     Past Medical  History:  Diagnosis Date  . COPD (chronic obstructive pulmonary disease) [facility])     Patient Active Problem List   Diagnosis Date Noted  . COPD exacerbation ([place]) 11/30/2019    History reviewed. No pertinent surgical history.   OB History   No obstetric history on file.     No family history on file.  Social History   Tobacco Use  . Smoking status: Never Smoker  . Smokeless tobacco: Never Used  Vaping Use  . Vaping Use: Never used  Substance Use Topics  . Alcohol use: Never  . Drug use: Never    Home Medications Prior to Admission medications   Medication Sig Start Date End Date Taking? Authorizing Provider  metFORMIN (GLUCOPHAGE) 500 MG tablet Take 1 tablet (500 mg total) by mouth 2 (two) times daily with a meal. 12/17/19   Kerin Perna, NP  OXYGEN Inhale 2.5-3 L into the lungs continuous.     [provider]  PRESCRIPTION MEDICATION Take 1,000 mg by mouth daily at 2 PM. "Daflon" from [facility]    [provider]  PRESCRIPTION MEDICATION Take 25 mg by mouth daily. "Spirotard" from [place]    [provider]  PRESCRIPTION MEDICATION Take 12 mcg by mouth daily. "Aerofor" from [place] -  inhale the contents of one capsule (12 mcg) into the lungs once daily    [provider]  rivaroxaban (XARELTO) 20 MG TABS tablet Take 1 tablet (20 mg total) by mouth every morning. 12/17/19   Kerin Perna, NP  tiotropium (SPIRIVA) 18 MCG  inhalation capsule Place 1 capsule (18 mcg total) into inhaler and inhale daily. 12/17/19   Kerin Perna, NP    Allergies    Patient has no known allergies.  Review of Systems   Review of Systems  Constitutional: Positive for chills and fatigue. Negative for fever.  Eyes: Negative for visual disturbance.  Respiratory: Negative for cough and shortness of breath.   Cardiovascular: Negative for chest pain.  Gastrointestinal: Negative for abdominal pain, diarrhea, nausea and vomiting.    Genitourinary: Positive for frequency. Negative for dysuria.  Neurological: Negative for dizziness, syncope, light-headedness and headaches.  Psychiatric/Behavioral: Positive for dysphoric mood. Negative for hallucinations and suicidal ideas.  All other systems reviewed and are negative.   Physical Exam Updated Vital Signs BP 131/73 (BP Location: Left Arm)   Pulse 83   Temp 97.7 F (36.5 C) (Oral)   Resp 18   Ht 5\' 1"  (1.549 m)   Wt 78.1 kg   SpO2 98%   BMI 32.53 kg/m   Physical Exam Vitals and nursing note reviewed.  Constitutional:      General: She is not in acute distress.    Appearance: She is well-developed. She is not toxic-appearing.  HENT:     Head: Normocephalic and atraumatic.  Eyes:     General:        Right eye: No discharge.        Left eye: No discharge.     Extraocular Movements: Extraocular movements intact.     Conjunctiva/sclera: Conjunctivae normal.     Pupils: Pupils are equal, round, and reactive to light.  Cardiovascular:     Rate and Rhythm: Normal rate and regular rhythm.  Pulmonary:     Effort: Pulmonary effort is normal. No respiratory distress.     Breath sounds: Normal breath sounds. No wheezing, rhonchi or rales.  Abdominal:     General: There is no distension.     Palpations: Abdomen is soft.     Tenderness: There is no abdominal tenderness.  Musculoskeletal:     Cervical back: Neck supple.  Skin:    General: Skin is warm and dry.     Findings: No rash.  Neurological:     Mental Status: She is alert and oriented to person, place, and time.     Comments: Clear speech.  CN III through XII grossly intact.  Sensation and strength grossly intact bilateral upper and lower extremities.  Ambulatory.  Psychiatric:        Attention and Perception: Attention normal.        Mood and Affect: Mood is depressed (mildly).        Thought Content: Thought content does not include homicidal or suicidal ideation. Thought content does not include  homicidal or suicidal plan.     ED Results / Procedures / Treatments   Labs (all labs ordered are listed, but only abnormal results are displayed) Labs Reviewed  BASIC METABOLIC PANEL - Abnormal; Notable for the following components:      Result Value   Chloride 94 (*)    CO2 40 (*)    All other components within normal limits  CBC - Abnormal; Notable for the following components:   HCT 49.8 (*)    MCV 102.9 (*)    MCHC 29.5 (*)    All other components within normal limits  HEPATIC FUNCTION PANEL - Abnormal; Notable for the following components:   Bilirubin, Direct 0.3 (*)    All other components within normal limits  RESPIRATORY PANEL BY RT PCR (FLU A&B, COVID)  URINALYSIS, ROUTINE W REFLEX MICROSCOPIC  TSH  CBG MONITORING, ED    EKG EKG Interpretation  Date/Time:  Monday March 02 2020 19:42:06 EDT Ventricular Rate:  85 PR Interval:  166 QRS Duration: 82 QT Interval:  372 QTC Calculation: 442 R Axis:   81 Text Interpretation: Normal sinus rhythm with sinus arrhythmia Normal ECG When compared with ECG of 11/30/2019, No significant change was found Confirmed by Delora Fuel ([id number]) on 03/02/2020 11:38:54 PM   Radiology No results found.  Procedures Procedures (including critical care time)  Medications Ordered in ED Medications - No data to display  ED Course  I have reviewed the triage vital signs and the nursing notes.  Pertinent labs & imaging results that were available during my care of the patient were reviewed by me and considered in my medical decision making (see chart for details).    MDM Rules/Calculators/A&P                         Patient presents to the ED with complaints of increasing fatigue, feeling down, and chills.  She is nontoxic and resting comfortably, her vitals on my assessment are within normal limits.  She was hypoxic without her oxygen upon arrival but has been saturating well on her baseline 3 L.  Physical exam is fairly  unremarkable.  Additional history obtained:  Additional history obtained from patient's son & chart review. EKG: No significant change compared to prior.  Lab Tests:  I reviewed and interpreted labs, which included:  Pre triage:  CBC: No significant anemia or leukocytosis.  BMP: Elevated CO2 somewhat similar to prior labs. No significant electrolyte derangement.  UA:  Pending I have added TSH, COVID test, & hepatic function panel:  Hepatic function panel: fairly unremarkable.  TSH: Pending COVID: Pending  Ambulatory on baseline 3L via [place] with SpO2 98%.   15:20: Patient care signed out to Martinique Robinson PA-C at change of shift pending remaining labs & disposition. Anticipate discharge home. Results & plan of care thus far have been discussed w/ patient, her son [name] @ bedside & her son [name] via telephone.   Findings and plan of care discussed with supervising physician Dr. Ashok Cordia who is in agreement.   Portions of this note were generated with Lobbyist. Dictation errors may occur despite best attempts at proofreading.  Final Clinical Impression(s) / ED Diagnoses Final diagnoses:  None    Rx / DC Orders ED Discharge Orders    None       Amaryllis Dyke, PA-C 03/03/20 1527    Lajean Saver, MD 03/04/20 [phone]

## 2020-03-04 LAB — URINE CULTURE: Culture: NO GROWTH

## 2021-03-16 IMAGING — CT CT CHEST W/O CM
2 of 4 series · 15 of 36 positions shown, 18 images · non-contrast
Comparison: None.

CLINICAL DATA: Hypoxia.

EXAM:
CT CHEST WITHOUT CONTRAST
TECHNIQUE: Multidetector CT imaging of the chest was performed following the
standard protocol without IV contrast.

[Series 3: chest wo · axial · 0.79mm/px · z∈[-185,+49]mm · 12 of 139 slices shown, 15 images]
[im 11/139  mediastinal]
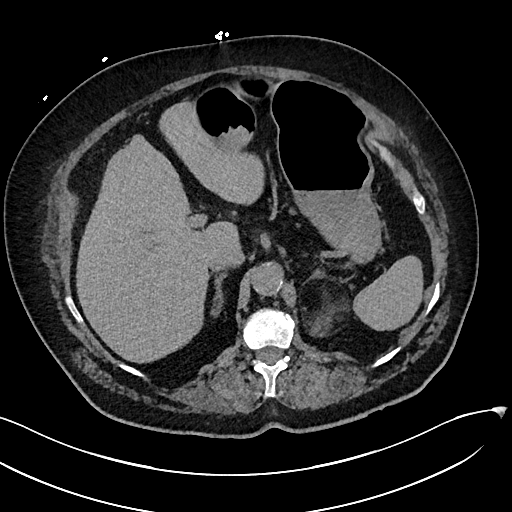
[im 11/139  lung]
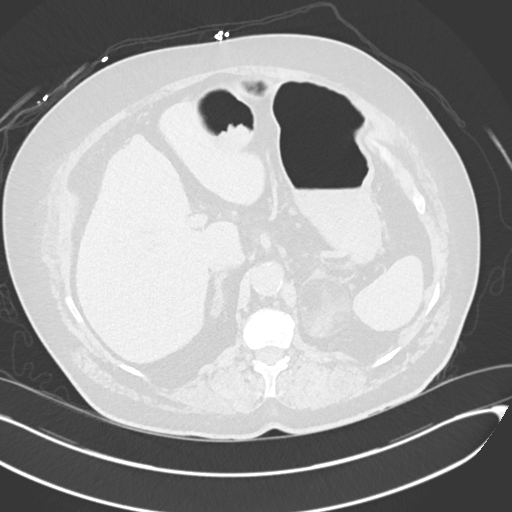
[im 22/139  lung]
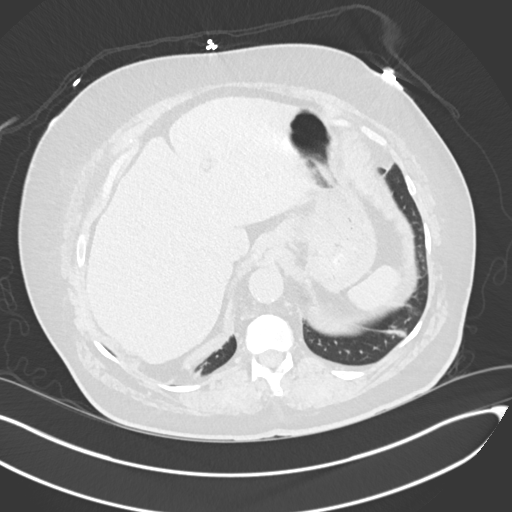
[im 32/139  lung]
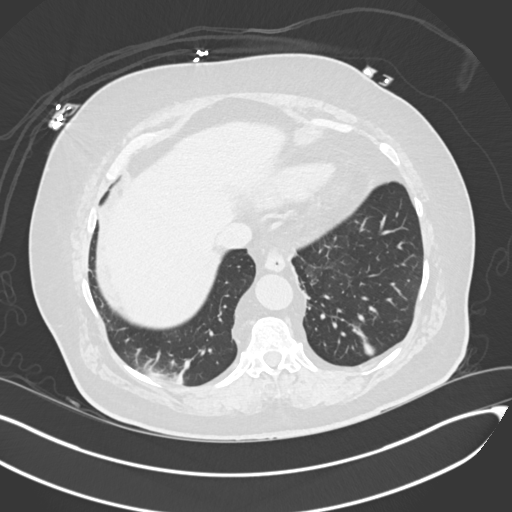
[im 43/139  lung]
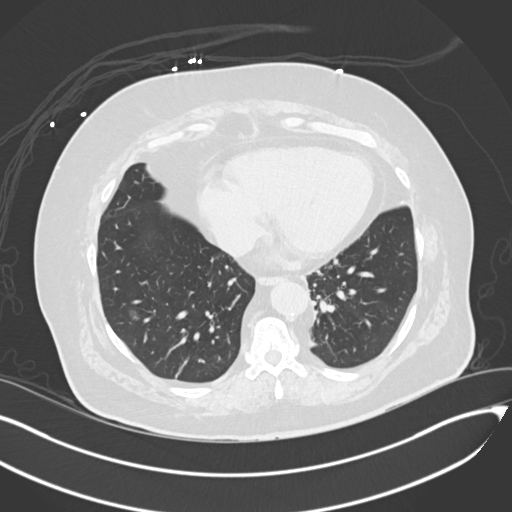
[im 54/139  mediastinal]
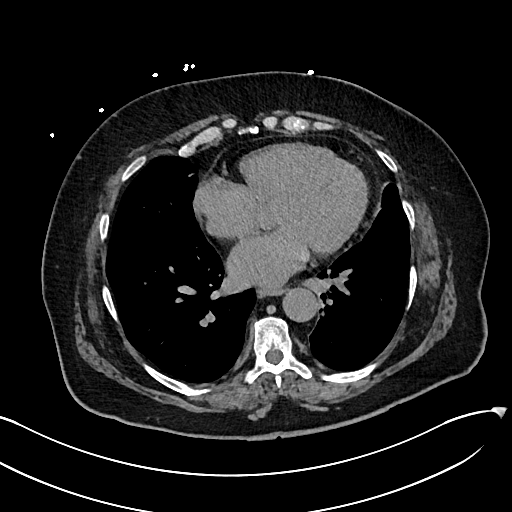
[im 54/139  lung]
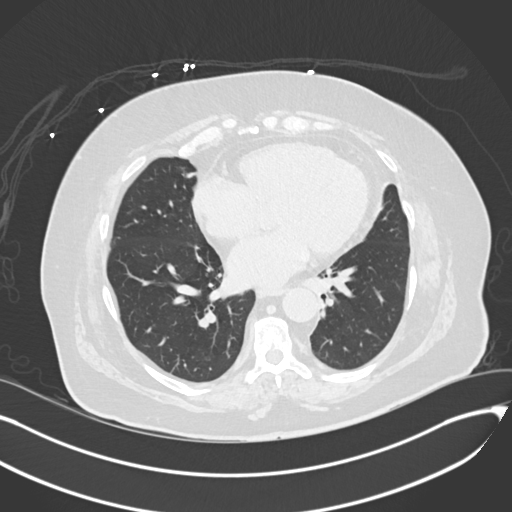
[im 64/139  lung]
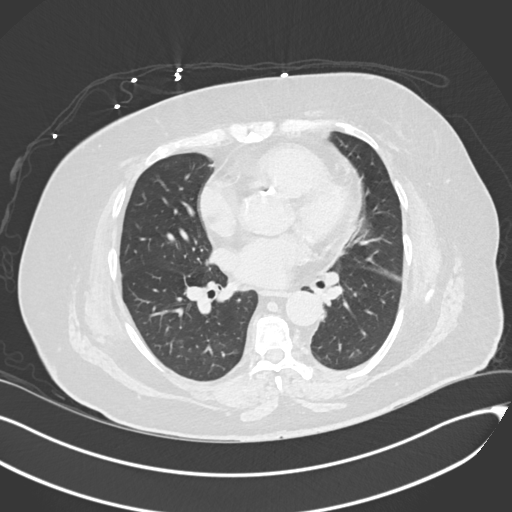
[im 75/139  lung]
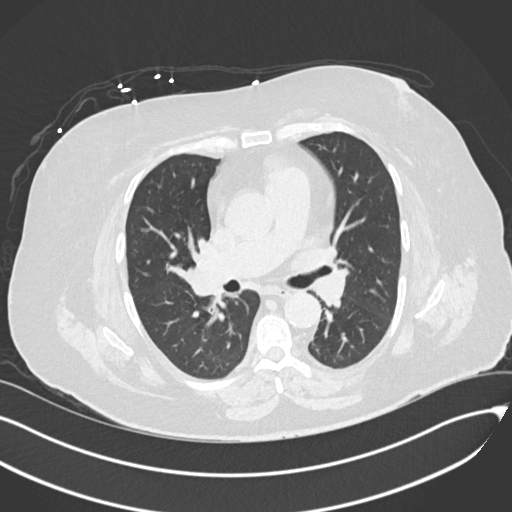
[im 85/139  lung]
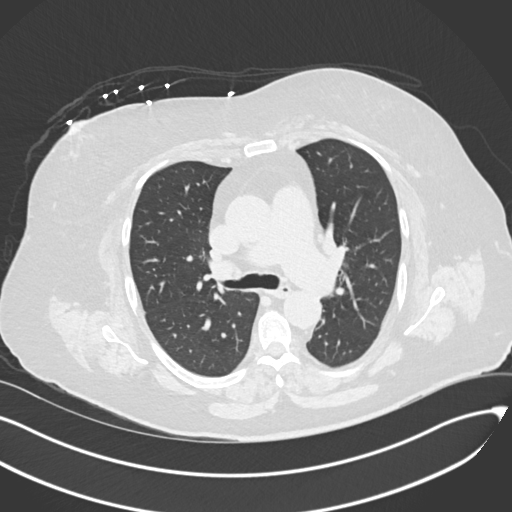
[im 96/139  mediastinal]
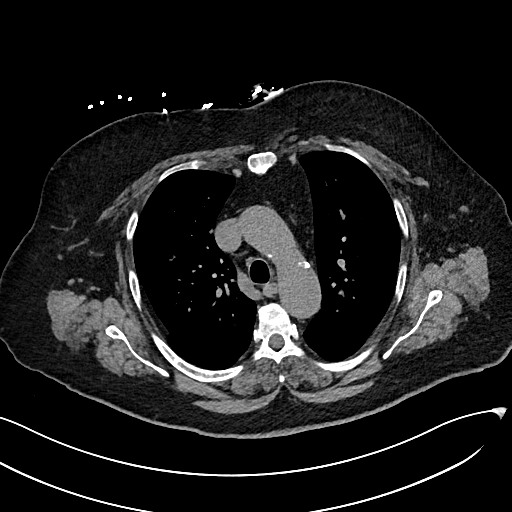
[im 96/139  lung]
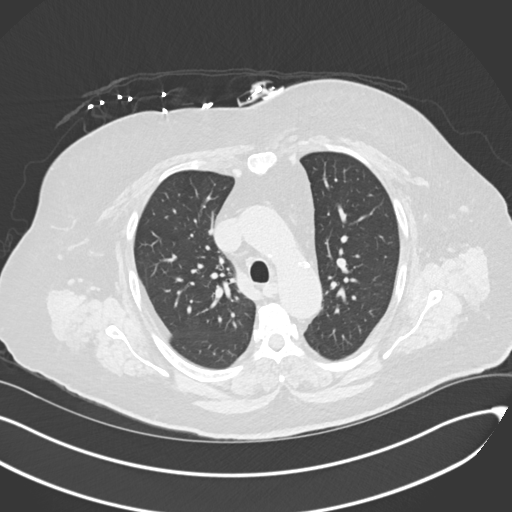
[im 107/139  lung]
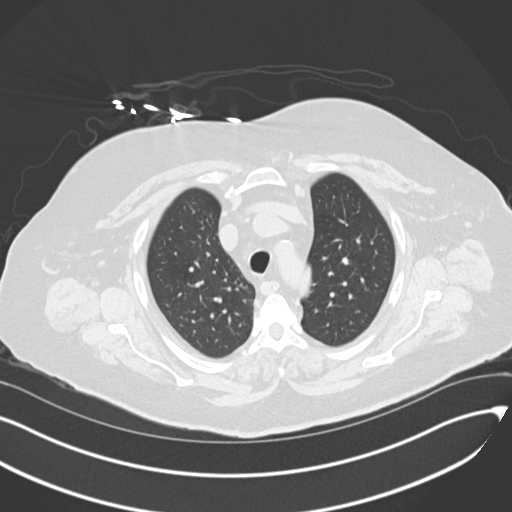
[im 117/139  lung]
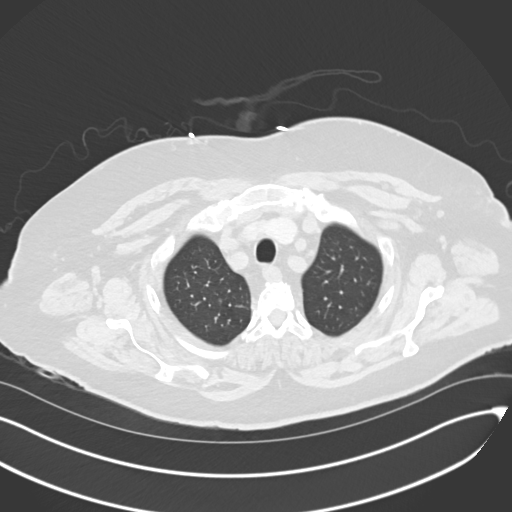
[im 128/139  lung]
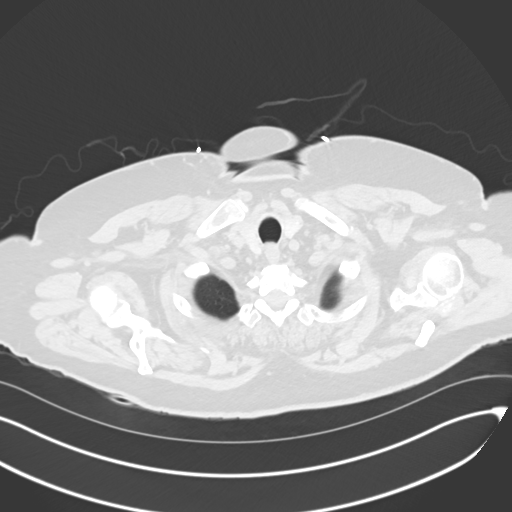

[Series 6: cor · coronal · 0.54mm/px · 3 of 170 slices shown]
[im 34/170  lung]
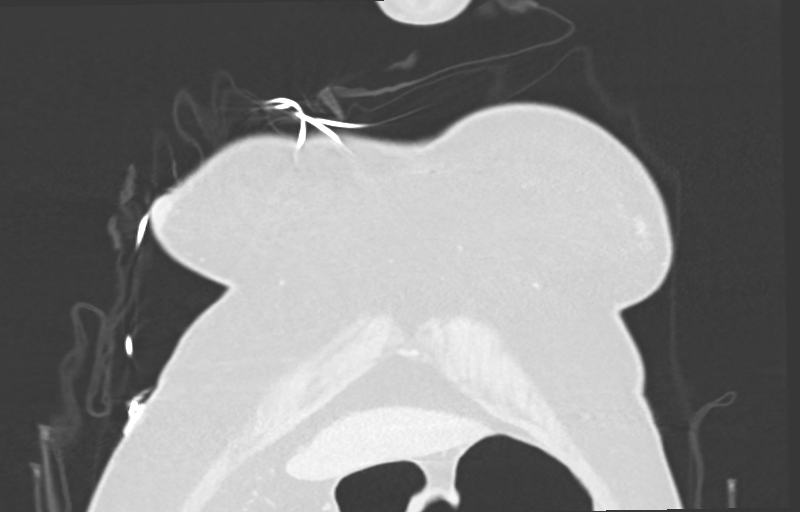
[im 68/170  lung]
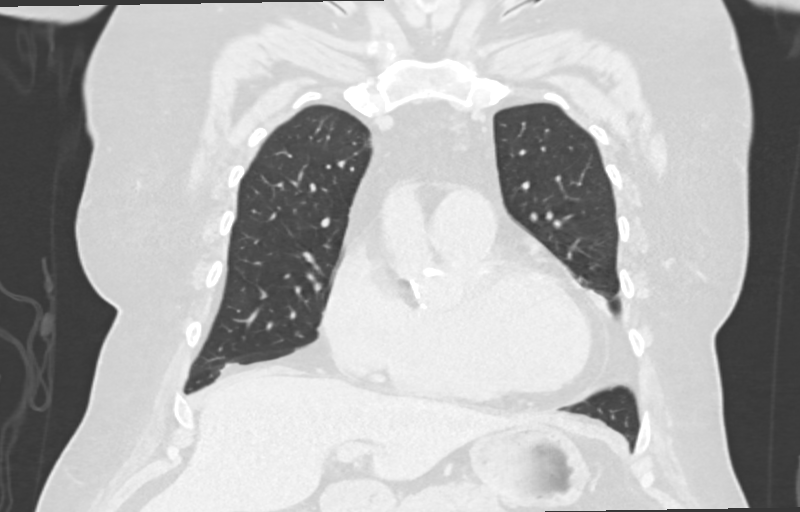
[im 102/170  lung]
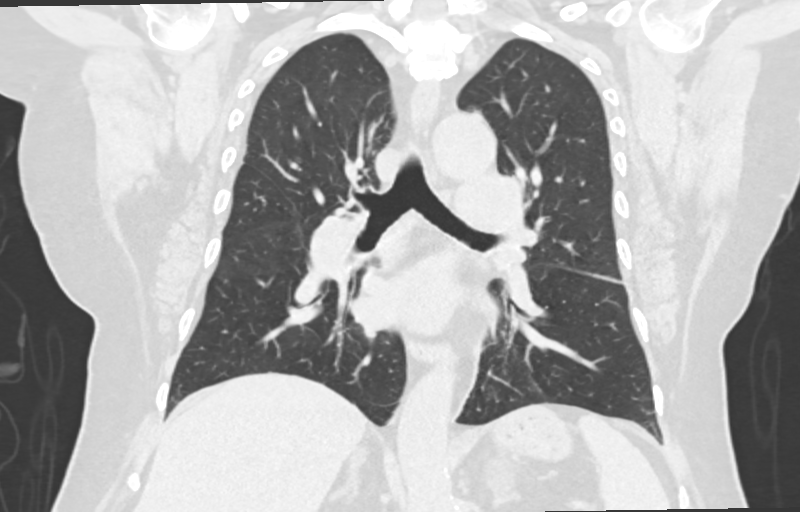

[15 of 36 positions shown; findings below may reference images not displayed]

FINDINGS: Cardiovascular: There is mild calcification of the aortic arch.
Normal heart size. No pericardial effusion.

Mediastinum/Nodes: No enlarged mediastinal or axillary lymph nodes.
Thyroid gland, trachea, and esophagus demonstrate no significant
findings.

Lungs/Pleura: Mild linear scarring and/or atelectasis is seen within
the posterior aspect of the bilateral lung bases.

There is no evidence of a pleural effusion or pneumothorax.

Upper Abdomen: A 4.8 cm x 4.1 cm fat and soft tissue density mass is
seen within the left adrenal gland.

Musculoskeletal: No chest wall mass or suspicious bone lesions
identified.
IMPRESSION: 1. Mild linear scarring and/or atelectasis within the posterior
aspect of the bilateral lung bases.
2. 4.8 cm x 4.1 cm fat and soft tissue density mass within the left
adrenal gland. This may represent an adrenal myelolipoma.
Correlation with adrenal protocol CT is recommended.
3. Aortic atherosclerosis.

Aortic Atherosclerosis ([id number]-JKX.X).
